# Patient Record
Sex: Female | Born: 1965 | Race: White | Hispanic: No | Marital: Married | Smoking: Never smoker
Health system: Southern US, Community
[De-identification: ages and names within clinical notes are randomized; demographics above are authoritative.]

## PROBLEM LIST (undated history)

## (undated) DIAGNOSIS — R911 Solitary pulmonary nodule: Secondary | ICD-10-CM

## (undated) DIAGNOSIS — L509 Urticaria, unspecified: Secondary | ICD-10-CM

## (undated) DIAGNOSIS — K219 Gastro-esophageal reflux disease without esophagitis: Secondary | ICD-10-CM

## (undated) DIAGNOSIS — T7840XA Allergy, unspecified, initial encounter: Secondary | ICD-10-CM

## (undated) DIAGNOSIS — I10 Essential (primary) hypertension: Secondary | ICD-10-CM

## (undated) DIAGNOSIS — IMO0002 Reserved for concepts with insufficient information to code with codable children: Secondary | ICD-10-CM

## (undated) HISTORY — PX: TONSILLECTOMY: SUR1361

## (undated) HISTORY — PX: UTERINE SEPTUM RESECTION: SHX5386

## (undated) HISTORY — PX: SINOSCOPY: SHX187

## (undated) HISTORY — DX: Essential (primary) hypertension: I10

## (undated) HISTORY — PX: APPENDECTOMY: SHX54

## (undated) HISTORY — PX: RHINOPLASTY: SUR1284

## (undated) HISTORY — DX: Urticaria, unspecified: L50.9

## (undated) HISTORY — PX: KIDNEY SURGERY: SHX687

## (undated) HISTORY — DX: Reserved for concepts with insufficient information to code with codable children: IMO0002

## (undated) HISTORY — DX: Allergy, unspecified, initial encounter: T78.40XA

## (undated) HISTORY — DX: Solitary pulmonary nodule: R91.1

## (undated) HISTORY — PX: CHOLECYSTECTOMY: SHX55

## (undated) HISTORY — DX: Gastro-esophageal reflux disease without esophagitis: K21.9

---

## 2012-03-03 ENCOUNTER — Ambulatory Visit: Payer: Self-pay

## 2012-03-26 ENCOUNTER — Other Ambulatory Visit: Payer: Self-pay

## 2012-03-26 ENCOUNTER — Ambulatory Visit (INDEPENDENT_AMBULATORY_CARE_PROVIDER_SITE_OTHER): Payer: BC Managed Care – PPO | Admitting: General Surgery

## 2012-03-26 ENCOUNTER — Encounter: Payer: Self-pay | Admitting: General Surgery

## 2012-03-26 VITALS — BP 124/76 | HR 72 | Resp 14 | Ht 64.0 in | Wt 174.0 lb

## 2012-03-26 DIAGNOSIS — N63 Unspecified lump in unspecified breast: Secondary | ICD-10-CM

## 2012-03-26 DIAGNOSIS — R928 Other abnormal and inconclusive findings on diagnostic imaging of breast: Secondary | ICD-10-CM

## 2012-03-26 NOTE — Patient Instructions (Signed)

## 2012-03-26 NOTE — Progress Notes (Addendum)
Patient ID: Bonnie Peck, female   DOB: [birth year], 47 y.o.   MRN: [id number]  Chief Complaint  Patient presents with  . Follow-up    mammogram    HPI Bonnie Peck is a 47 y.o. female here today for a follow up mammogram 03/03/12 cat 4 @[facility] .Patient states she feel no lumps and has never had breast problems before.  HPI  Past Medical History  Diagnosis Date  . Hypertension     Past Surgical History  Procedure Laterality Date  . Kidney surgery Left born   . Appendectomy  age 59  . Cesarean section  1998    Family History  Problem Relation Age of Onset  . Melanoma Maternal Grandmother     Social History History  Substance Use Topics  . Smoking status: Not on file  . Smokeless tobacco: Never Used  . Alcohol Use: Yes    Allergies  Allergen Reactions  . Corticosteroids Hives  . Penicillins Hives  . Sulfa Antibiotics Hives    No current outpatient prescriptions on file.   No current facility-administered medications for this visit.    Review of Systems Review of Systems  Constitutional: Negative.   Respiratory: Negative.   Cardiovascular: Negative.     Blood pressure 124/76, pulse 72, resp. rate 14, height 5\' 4"  (1.626 m), weight 174 lb (78.926 kg), last menstrual period 03/23/2012.  Physical Exam Physical Exam  Constitutional: She appears well-developed and well-nourished.  Neck: Normal range of motion. Neck supple.  Cardiovascular: Normal rate, regular rhythm and normal heart sounds.   Pulmonary/Chest: Right breast exhibits no inverted nipple, no mass, no nipple discharge, no skin change and no tenderness. Left breast exhibits no inverted nipple, no mass, no nipple discharge, no skin change and no tenderness. Breasts are asymmetrical (left breast fuller than right ).    Data Reviewed Screening mammogram dated 02/22/2012 showed dense breast. Benign calcifications bilaterally. Irregular density in the upper-outer quadrant of the right breast for which  additional views were requested. I read-0.  Focal spot compression views and ultrasound of the right breast in 03/03/2012 showed a persistent nodular density at the 11:00 position. Ultrasound showed a 0.5 x 0.8 x 1.0 cm complex nodule. This was felt to correspond to the mammographic abnormality and was indeterminate. BIRAD-4.  Ultrasound completed today confirming the above mentioned lesion biopsy was completed.  Assessment    Right breast nodule    Plan    Biopsy will be completed. Follow up be based on the results of that study.       Earline Mayotte 03/29/2012, 11:18 AM  Pathology from the biopsy completed 03/26/2012 is benign. Results have been conveyed to the patient.  PATH REPORT.SITE OF ORIGIN SPEC  Comment   Comments: Material submitted: . BREAST, RIGHT 11:00 ULTRASOUND GUIDED CORE BIOPSY *STAT* PATH REPORT.FINAL DX SPEC  Comment   Comments: Clinician provided ICD-9: 611.72 ; Lump or mass in breast PATH REPORT.RELEVANT HX SPEC  Comment   Comments: Pre-operative diagnosis: . RIGHT BREAST 11:00, US GUIDED PATH REPORT.FINAL DX SPEC  Comment   Comments: Diagnosis: BREAST, RIGHT 11:00 ULTRASOUND GUIDED CORE BIOPSY *STAT*: - BENIGN BREAST TISSUE WITH FIBROCYSTIC CHANGES INCLUDING CYST WALL FRAGMENTS WITH APOCRINE METAPLASIA. NOTE: These results were called to Dr. Lemar Livings at 1420 on 03/27/12. RVA/03/27/2012 SIGNED OUT BY:  Comment   Comments: Electronically signed: Kermit Balo, MD, Pathologist

## 2012-03-27 ENCOUNTER — Telehealth: Payer: Self-pay | Admitting: *Deleted

## 2012-03-27 ENCOUNTER — Telehealth: Payer: Self-pay | Admitting: General Surgery

## 2012-03-27 LAB — PATHOLOGY

## 2012-03-27 NOTE — Telephone Encounter (Signed)
Telephone report from Filbert Schilder, MD in pathology re: right breast biopsy of March 26, 2012 showed apocrine metaplasia and cyst material. (Benign).  Message left for the patient to call for details. She will follow up with the nurse in one week for a wound check.

## 2012-03-27 NOTE — Telephone Encounter (Signed)
Reviewed as documented by Dr. Lemar Livings, pt pleased.

## 2012-03-29 ENCOUNTER — Encounter: Payer: Self-pay | Admitting: General Surgery

## 2012-04-01 ENCOUNTER — Ambulatory Visit (INDEPENDENT_AMBULATORY_CARE_PROVIDER_SITE_OTHER): Payer: BC Managed Care – PPO | Admitting: *Deleted

## 2012-04-01 DIAGNOSIS — N63 Unspecified lump in unspecified breast: Secondary | ICD-10-CM

## 2012-04-01 NOTE — Patient Instructions (Addendum)
Patient here today for follow up post breast biopsy.  Dressing removed, steristrip in place and aware it may come off in one week.  Minimal bruising noted.  The patient is aware that a heating pad may be used for comfort as needed.  Aware of pathology. Follow up as scheduled 6 months or sooner if needed.

## 2012-04-01 NOTE — Progress Notes (Signed)
Patient here today for follow up post right breast biopsy.  Dressing removed, steristrip in place and aware it may come off in one week.  Minimal bruising noted.  The patient is aware that a heating pad may be used for comfort as needed.  Aware of pathology. Follow up as scheduled in 6 months.

## 2012-08-21 ENCOUNTER — Telehealth: Payer: Self-pay | Admitting: General Surgery

## 2012-08-21 NOTE — Telephone Encounter (Signed)
Documents from March 2014 biopsy reviewed. Benign lesion. Patient has declined f/u here. F/U with PCP per her desired.

## 2012-09-24 ENCOUNTER — Ambulatory Visit: Payer: BC Managed Care – PPO | Admitting: General Surgery

## 2013-11-02 ENCOUNTER — Encounter: Payer: Self-pay | Admitting: General Surgery

## 2014-04-09 ENCOUNTER — Emergency Department
Admission: EM | Admit: 2014-04-09 | Discharge: 2014-04-09 | Disposition: A | Discharge status: Home / Self Care | Payer: BLUE CROSS/BLUE SHIELD | Source: Home / Self Care | Attending: Emergency Medicine | Admitting: Emergency Medicine

## 2014-04-09 ENCOUNTER — Encounter: Payer: Self-pay | Admitting: *Deleted

## 2014-04-09 DIAGNOSIS — N390 Urinary tract infection, site not specified: Secondary | ICD-10-CM

## 2014-04-09 LAB — POCT URINALYSIS DIP (MANUAL ENTRY)
Bilirubin, UA: NEGATIVE
Blood, UA: NEGATIVE
Glucose, UA: NEGATIVE
Ketones, POC UA: NEGATIVE
Nitrite, UA: NEGATIVE
Protein Ur, POC: NEGATIVE
Spec Grav, UA: 1.02 (ref 1.005–1.03)
Urobilinogen, UA: 0.2 (ref 0–1)
pH, UA: 7 (ref 5–8)

## 2014-04-09 MED ORDER — FLUCONAZOLE 150 MG PO TABS: 150.0000 mg | ORAL_TABLET | Freq: Every day | ORAL | Status: DC

## 2014-04-09 MED ORDER — NITROFURANTOIN MONOHYD MACRO 100 MG PO CAPS: 100.0000 mg | ORAL_CAPSULE | Freq: Two times a day (BID) | ORAL | Status: DC

## 2014-04-09 NOTE — ED Provider Notes (Signed)
CSN: [id number]     Arrival date & time 04/09/14  1547 History   First MD Initiated Contact with Patient 04/09/14 1645     Chief Complaint  Patient presents with  . Dysuria   (Consider location/radiation/quality/duration/timing/severity/associated sxs/prior Treatment) Patient is a 49 y.o. female presenting with dysuria. The history is provided by the patient. No language interpreter was used.  Dysuria Pain quality:  Burning Pain severity:  Mild Onset quality:  Gradual Duration:  4 days Timing:  Constant Progression:  Worsening Chronicity:  New Recent urinary tract infections: no   Relieved by:  Nothing Worsened by:  Nothing tried Ineffective treatments:  None tried Urinary symptoms: no frequent urination   Associated symptoms: nausea   Risk factors: no recurrent urinary tract infections    patient is currently on Keflex for ear infection and strep throat. She began having burning with urination 4 days ago.  patient reports she has a history of urinary tract infection she usually responds well to [facility]  Past Medical History  Diagnosis Date  . Hypertension    Past Surgical History  Procedure Laterality Date  . Kidney surgery Left born   . Appendectomy  age 33  . Cesarean section  1998  . Uterine septum resection     Family History  Problem Relation Age of Onset  . Melanoma Maternal Grandmother   . Heart attack Mother   . Diabetes Mother   . Hypertension Mother   . Hyperlipidemia Father    History  Substance Use Topics  . Smoking status: Never Smoker   . Smokeless tobacco: Never Used  . Alcohol Use: Yes   OB History    Obstetric Comments   First menstrual 11     Review of Systems  Gastrointestinal: Positive for nausea.  Genitourinary: Positive for dysuria.  All other systems reviewed and are negative.   Allergies  Ciprofloxacin; Corticosteroids; Erythromycin; Penicillins; and Sulfa antibiotics  Home Medications   Prior to Admission medications    Medication Sig Start Date End Date Taking? Authorizing Provider  cephALEXin (KEFLEX) 500 MG capsule Take 500 mg by mouth 4 (four) times daily.   Yes Historical Provider, MD  labetalol (NORMODYNE) 200 MG tablet Take 200 mg by mouth 2 (two) times daily.   Yes Historical Provider, MD  fluconazole (DIFLUCAN) 150 MG tablet Take 1 tablet (150 mg total) by mouth daily. 04/09/14   Fransico Meadow, PA-C  nitrofurantoin, macrocrystal-monohydrate, (MACROBID) 100 MG capsule Take 1 capsule (100 mg total) by mouth 2 (two) times daily. 04/09/14   Fransico Meadow, PA-C   BP 173/101 mmHg  Pulse 86  Temp(Src) 98.9 F (37.2 C) (Oral)  Resp 14  Ht 5\' 4"  (1.626 m)  Wt 191 lb (86.637 kg)  BMI 32.77 kg/m2  SpO2 99%  LMP 03/09/2014 Physical Exam  Constitutional: She is oriented to person, place, and time. She appears well-developed and well-nourished.  HENT:  Head: Normocephalic and atraumatic.  Eyes: Conjunctivae and EOM are normal. Pupils are equal, round, and reactive to light.  Neck: Normal range of motion.  Cardiovascular: Normal rate.   Pulmonary/Chest: Effort normal and breath sounds normal.  Abdominal: Soft. She exhibits no distension.  Musculoskeletal: Normal range of motion.  Neurological: She is alert and oriented to person, place, and time.  Skin: Skin is warm.  Psychiatric: She has a normal mood and affect.  Nursing note and vitals reviewed.   ED Course  Procedures (including critical care time) Labs Review Labs Reviewed  URINE CULTURE  POCT URINALYSIS DIP (MANUAL ENTRY)   Trace of leukocytes Imaging Review No results found.   MDM urine culture pending   1. Urinary tract infection without hematuria, site unspecified    macrobid Diflucan AVS    Fransico Meadow, PA-C 04/09/14 2012

## 2014-04-09 NOTE — ED Notes (Signed)
Pt c/o dysuria and polyuria x 3-4 days. Currently on Keflex for ear infection and strep. Allergic to several antibiotics. Macrobid has worked well in the past. 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

## 2014-04-09 NOTE — Discharge Instructions (Signed)

## 2014-04-11 ENCOUNTER — Telehealth: Payer: Self-pay | Admitting: *Deleted

## 2014-04-11 LAB — URINE CULTURE
Colony Count: NO GROWTH
Organism ID, Bacteria: NO GROWTH

## 2014-05-04 ENCOUNTER — Encounter: Payer: Self-pay | Admitting: *Deleted

## 2014-05-04 ENCOUNTER — Emergency Department
Admission: EM | Admit: 2014-05-04 | Discharge: 2014-05-04 | Disposition: A | Discharge status: Home / Self Care | Payer: BLUE CROSS/BLUE SHIELD | Source: Home / Self Care | Attending: Emergency Medicine | Admitting: Emergency Medicine

## 2014-05-04 DIAGNOSIS — J029 Acute pharyngitis, unspecified: Secondary | ICD-10-CM | POA: Diagnosis not present

## 2014-05-04 LAB — POCT RAPID STREP A (OFFICE): Rapid Strep A Screen: NEGATIVE

## 2014-05-04 MED ORDER — CEPHALEXIN 500 MG PO CAPS: 500.0000 mg | ORAL_CAPSULE | Freq: Three times a day (TID) | ORAL | Status: DC

## 2014-05-04 NOTE — ED Notes (Addendum)
Pt c/o sore throat, white spots on throat, and swollen glands x 11 days. She took old rx for keflex bid for 3 days with minimal relief.

## 2014-05-04 NOTE — ED Provider Notes (Signed)
CSN: [id number]     Arrival date & time 05/04/14  1107 History   First MD Initiated Contact with Patient 05/04/14 1109     Chief Complaint  Patient presents with  . Sore Throat   Here with husband HPI SORE THROAT Onset: 11 days ago.--She's used left over Keflex 500 twice a day for the past 5 days and she feels somewhat improved on this and requests a full prescription for Keflex. Note that she is allergic to penicillin, but has taken Keflex in the past without side effects or reactions.    Severity: moderate Tried OTC meds without significant relief.  Symptoms:  + Low-grade Fever  + Swollen neck glands No Recent Strep Exposure     No Myalgias No Headache No Rash Positive for fatigue  No Discolored Nasal Mucus No Allergy symptoms No sinus pain/pressure No itchy/red eyes Positive bilateral mild ear pain but no drainage or any acute hearing change.  No Drooling No Trismus  No Nausea No Vomiting No Abdominal pain No Diarrhea No Reflux symptoms  No Cough No Breathing Difficulty No Shortness of Breath No pleuritic pain No Wheezing No Hemoptysis  Remainder of Review of Systems negative for acute change except as noted in the HPI.  Past Medical History  Diagnosis Date  . Hypertension    Past Surgical History  Procedure Laterality Date  . Kidney surgery Left born   . Appendectomy  age 71  . Cesarean section  1998  . Uterine septum resection     Family History  Problem Relation Age of Onset  . Melanoma Maternal Grandmother   . Heart attack Mother   . Diabetes Mother   . Hypertension Mother   . Hyperlipidemia Father    History  Substance Use Topics  . Smoking status: Never Smoker   . Smokeless tobacco: Never Used  . Alcohol Use: Yes   OB History    Obstetric Comments   First menstrual 11     Review of Systems Remainder of Review of Systems negative for acute change except as noted in the HPI.  Allergies  Ciprofloxacin; Corticosteroids;  Erythromycin; Penicillins; and Sulfa antibiotics  Home Medications   Prior to Admission medications   Medication Sig Start Date End Date Taking? Authorizing Provider  cephALEXin (KEFLEX) 500 MG capsule Take 1 capsule (500 mg total) by mouth 3 (three) times daily. For 10 days 05/04/14   Jacqulyn Cane, MD  labetalol (NORMODYNE) 200 MG tablet Take 200 mg by mouth 2 (two) times daily.    Historical Provider, MD   BP 161/101 mmHg  Pulse 87  Temp(Src) 98.6 F (37 C) (Oral)  Resp 16  Ht 5\' 4"  (1.626 m)  Wt 189 lb (85.73 kg)  BMI 32.43 kg/m2  SpO2 98%  LMP 04/20/2014 Physical Exam  Constitutional: She is oriented to person, place, and time. She appears well-developed and well-nourished. She is cooperative.  Non-toxic appearance. No distress.  HENT:  Head: Normocephalic and atraumatic.  Right Ear: Tympanic membrane, external ear and ear canal normal.  Left Ear: Tympanic membrane, external ear and ear canal normal.  Nose: Nose normal. Right sinus exhibits no maxillary sinus tenderness and no frontal sinus tenderness. Left sinus exhibits no maxillary sinus tenderness and no frontal sinus tenderness.  Mouth/Throat: Mucous membranes are normal. Posterior oropharyngeal erythema present. No oropharyngeal exudate or posterior oropharyngeal edema.  Posterior pharynx very red without exudate. Surgically absent tonsils.  Eyes: Conjunctivae are normal. No scleral icterus.  Neck: Neck supple.  Cardiovascular: Normal  rate, regular rhythm and normal heart sounds.   No murmur heard. Pulmonary/Chest: Effort normal and breath sounds normal. No stridor. No respiratory distress. She has no wheezes. She has no rales.  Musculoskeletal: She exhibits no edema.  Lymphadenopathy:    She has cervical adenopathy.       Right cervical: Superficial cervical adenopathy present. No deep cervical and no posterior cervical adenopathy present.      Left cervical: Superficial cervical adenopathy present. No deep cervical and  no posterior cervical adenopathy present.  Neurological: She is alert and oriented to person, place, and time.  Skin: Skin is warm and dry.  Psychiatric: She has a normal mood and affect.  Nursing note and vitals reviewed.   ED Course  Procedures (including critical care time) Labs Review Labs Reviewed  POCT RAPID STREP A (OFFICE)   Results for orders placed or performed during the hospital encounter of 05/04/14  POCT rapid strep A  Result Value Ref Range   Rapid Strep A Screen Negative Negative    MDM   1. Acute pharyngitis, unspecified pharyngitis type    Treatment options discussed, as well as risks, benefits, alternatives. She declined sending strep culture off Patient voiced understanding and agreement with the following plans: cephALEXin (KEFLEX) 500 MG capsule Take 1 capsule (500 mg total) by mouth 3 (three) times daily. For 10 days   Other symptomatic care discussed Follow-up with your primary care doctor in 5-7 days if not improving, or sooner if symptoms become worse. Precautions discussed. Red flags discussed. Questions invited and answered. Patient voiced understanding and agreement.      Jacqulyn Cane, MD 05/04/14 [phone]

## 2015-03-11 ENCOUNTER — Ambulatory Visit (INDEPENDENT_AMBULATORY_CARE_PROVIDER_SITE_OTHER): Payer: BLUE CROSS/BLUE SHIELD | Admitting: Family Medicine

## 2015-03-11 ENCOUNTER — Encounter: Payer: Self-pay | Admitting: Family Medicine

## 2015-03-11 VITALS — BP 197/124 | HR 80 | Ht 65.5 in | Wt 187.0 lb

## 2015-03-11 DIAGNOSIS — Q600 Renal agenesis, unilateral: Secondary | ICD-10-CM | POA: Insufficient documentation

## 2015-03-11 DIAGNOSIS — L659 Nonscarring hair loss, unspecified: Secondary | ICD-10-CM | POA: Diagnosis not present

## 2015-03-11 DIAGNOSIS — B839 Helminthiasis, unspecified: Secondary | ICD-10-CM

## 2015-03-11 DIAGNOSIS — I10 Essential (primary) hypertension: Secondary | ICD-10-CM

## 2015-03-11 MED ORDER — ALBENDAZOLE 200 MG PO TABS: ORAL_TABLET | ORAL | Status: DC

## 2015-03-11 NOTE — Progress Notes (Signed)
CC: Bonnie Peck is a 50 y.o. female is here for Establish Care   Subjective: HPI:  Very pleasant 50 year old here to establish care, wife of Bonnie Peck  She has a history of essential hypertension she's tried multiple antihypertensives however always seems to get some form of intolerance or side effects that makes her stop her current antihypertensive medication. She had been taking labetalol and tolerating this however after stopping the medication she's noticed that blood pressures at home consistently are below 140/90.  Earlier this week she noticed worms in her stool. This happened 1 week after she was petting some kittens at a local pet store and was told by the manager that these were highly infectious. She denies any abdominal pain, nausea, nor blood in stool.  She's been experiencing some hair loss for the past 6+ months. She had blood work done by her OB/GYN last week and was told everything looks normal but she would like to know the specifics of the results. She is worried she may be hypothyroid   Review of Systems - General ROS: negative for - chills, fever, night sweats, weight gain or weight loss Ophthalmic ROS: negative for - decreased vision Psychological ROS: negative for - anxiety or depression ENT ROS: negative for - hearing change, nasal congestion, tinnitus or allergies Hematological and Lymphatic ROS: negative for - bleeding problems, bruising or swollen lymph nodes Breast ROS: negative Respiratory ROS: no cough, shortness of breath, or wheezing Cardiovascular ROS: no chest pain or dyspnea on exertion Gastrointestinal ROS: no abdominal pain, change in bowel habits, or black or bloody stools Genito-Urinary ROS: negative for - genital discharge, genital ulcers, incontinence or abnormal bleeding from genitals Musculoskeletal ROS: negative for - joint pain or muscle pain Neurological ROS: negative for - headaches or memory loss Dermatological ROS: negative for lumps, mole  changes, rash and skin lesion changes  Past Medical History  Diagnosis Date  . Hypertension     Past Surgical History  Procedure Laterality Date  . Kidney surgery Left born   . Appendectomy  age 36  . Cesarean section  1998  . Uterine septum resection     Family History  Problem Relation Age of Onset  . Melanoma Maternal Grandmother   . Heart attack Mother   . Diabetes Mother   . Hypertension Mother   . Hyperlipidemia Father     Social History   Social History  . Marital Status: Married    Spouse Name: N/A  . Number of Children: N/A  . Years of Education: N/A   Occupational History  . Not on file.   Social History Main Topics  . Smoking status: Never Smoker   . Smokeless tobacco: Never Used  . Alcohol Use: Yes  . Drug Use: No  . Sexual Activity: Not on file   Other Topics Concern  . Not on file   Social History Narrative     Objective: BP 197/124 mmHg  Pulse 80  Ht 5' 5.5" (1.664 m)  Wt 187 lb (84.823 kg)  BMI 30.63 kg/m2  Vital signs reviewed. General: Alert and Oriented, No Acute Distress HEENT: Pupils equal, round, reactive to light. Conjunctivae clear.  External ears unremarkable.  Moist mucous membranes. Lungs: Clear and comfortable work of breathing, speaking in full sentences without accessory muscle use. Cardiac: Regular rate and rhythm.  Neuro: CN II-XII grossly intact, gait normal. Extremities: No peripheral edema.  Strong peripheral pulses.  Mental Status: No depression, anxiety, nor agitation. Logical though process. Skin: Warm  and dry.  Assessment & Plan: Bonnie Peck was seen today for establish care.  Diagnoses and all orders for this visit:  Essential hypertension  Worms in stool -     albendazole (ALBENZA) 200 MG tablet; Two by mouth at once.  Hair loss   Essential hypertension: Will consider this white coat hypertension provided she can continue to monitor her blood pressure at home and states below 140/90 Worms in stool: Start  single dose of albendazole, although one week if still noticing foreign fecal matter Hair Loss: Obtaining outside records, I'll let her know what my interpretation is on these labs and if she needs to have any additional lab work done.  Return in about 6 months (around 09/11/2015) for BP Follow up.

## 2015-03-25 ENCOUNTER — Ambulatory Visit (INDEPENDENT_AMBULATORY_CARE_PROVIDER_SITE_OTHER): Payer: BLUE CROSS/BLUE SHIELD | Admitting: Family Medicine

## 2015-03-25 ENCOUNTER — Ambulatory Visit (INDEPENDENT_AMBULATORY_CARE_PROVIDER_SITE_OTHER): Payer: BLUE CROSS/BLUE SHIELD

## 2015-03-25 ENCOUNTER — Telehealth: Payer: Self-pay | Admitting: Family Medicine

## 2015-03-25 ENCOUNTER — Encounter: Payer: Self-pay | Admitting: Family Medicine

## 2015-03-25 VITALS — BP 202/139 | HR 90 | Wt 189.0 lb

## 2015-03-25 DIAGNOSIS — K8020 Calculus of gallbladder without cholecystitis without obstruction: Secondary | ICD-10-CM

## 2015-03-25 DIAGNOSIS — R109 Unspecified abdominal pain: Secondary | ICD-10-CM | POA: Diagnosis not present

## 2015-03-25 DIAGNOSIS — N281 Cyst of kidney, acquired: Secondary | ICD-10-CM

## 2015-03-25 DIAGNOSIS — G4700 Insomnia, unspecified: Secondary | ICD-10-CM | POA: Diagnosis not present

## 2015-03-25 LAB — POCT URINALYSIS DIPSTICK
Bilirubin, UA: NEGATIVE
Glucose, UA: NEGATIVE
Ketones, UA: NEGATIVE
Leukocytes, UA: NEGATIVE
Nitrite, UA: NEGATIVE
PH UA: 6
PROTEIN UA: NEGATIVE
SPEC GRAV UA: 1.02
Urobilinogen, UA: 0.2

## 2015-03-25 MED ORDER — CEPHALEXIN 500 MG PO CAPS: 500.0000 mg | ORAL_CAPSULE | Freq: Three times a day (TID) | ORAL | Status: DC

## 2015-03-25 MED ORDER — ZOLPIDEM TARTRATE 10 MG PO TABS: 5.0000 mg | ORAL_TABLET | Freq: Every evening | ORAL | Status: DC | PRN

## 2015-03-25 NOTE — Telephone Encounter (Signed)
Will you please let patient know that her ultrasound did not show any signs of a kidney stone.  The radiologist wasn't able to completely visualize the center of the kidney and has recommended a non-emergent ct scan to rule out any kidney anatomy abnormality.

## 2015-03-25 NOTE — Telephone Encounter (Signed)
Pt.notified

## 2015-03-25 NOTE — Progress Notes (Signed)
CC: Bonnie Peck is a 50 y.o. female is here for Flank Pain   Subjective: HPI:  2 weeks of right-sided flank pain that is ranging from 5-10 on the 0-10 pain scale. Nothing seems to make it better or worse it's slowly worsening since onset. Keeps her awake at night. It radiates into the right side of the anterior pelvis. Recent history significant for bacterial vaginosis treated with Flagyl and then later a strep UTI treated with Macrobid. She denies blood in her urine. She denies urinary urgency or hesitancy. She denies any other genitourinary complaints. She's been somewhat nauseous but denies fevers or chills. She denies back pain. She denies any gastrointestinal complaints.   Review Of Systems Outlined In HPI  Past Medical History  Diagnosis Date  . Hypertension     Past Surgical History  Procedure Laterality Date  . Kidney surgery Left born   . Appendectomy  age 52  . Cesarean section  1998  . Uterine septum resection    . Rhinoplasty     Family History  Problem Relation Age of Onset  . Melanoma Maternal Grandmother   . Heart attack Maternal Grandmother   . Heart attack Mother   . Diabetes Mother   . Hypertension Mother   . Hyperlipidemia Father   . Colon cancer Father     Social History   Social History  . Marital Status: Married    Spouse Name: N/A  . Number of Children: N/A  . Years of Education: N/A   Occupational History  . Not on file.   Social History Main Topics  . Smoking status: Never Smoker   . Smokeless tobacco: Never Used  . Alcohol Use: Yes  . Drug Use: No  . Sexual Activity: Yes   Other Topics Concern  . Not on file   Social History Narrative     Objective: BP 202/139 mmHg  Pulse 90  Wt 189 lb (85.73 kg)  Vital signs reviewed. General: Alert and Oriented, No Acute Distress HEENT: Pupils equal, round, reactive to light. Conjunctivae clear.  External ears unremarkable.  Moist mucous membranes. Lungs: Clear and comfortable work of  breathing, speaking in full sentences without accessory muscle use. Cardiac: Regular rate and rhythm.  Neuro: CN II-XII grossly intact, gait normal. Extremities: No peripheral edema.  Strong peripheral pulses.  Mental Status: No depression, anxiety, nor agitation. Logical though process. Skin: Warm and dry. No right-sided CVA tenderness  Assessment & Plan: Bonnie Peck was seen today for flank pain.  Diagnoses and all orders for this visit:  Flank pain -     POCT Urinalysis Dipstick -     Urine Culture; Standing -     Urine Culture -     US Renal  Insomnia -     zolpidem (AMBIEN) 10 MG tablet; Take 0.5-1 tablets (5-10 mg total) by mouth at bedtime as needed for sleep.  Other orders -     cephALEXin (KEFLEX) 500 MG capsule; Take 1 capsule (500 mg total) by mouth 3 (three) times daily.   Flank pain: Blood in urine most likely secondary to not eradicated strep infection seen earlier this month with her OB/GYN. Start Keflex pending a renal ultrasound to rule out nephrolithiasis. Symptoms have been keeping her awake at night she was not herself and she can take to help with sleep. No Follow-up on file.

## 2015-03-26 LAB — URINE CULTURE

## 2015-03-28 ENCOUNTER — Other Ambulatory Visit: Payer: Self-pay | Admitting: Family Medicine

## 2015-03-28 DIAGNOSIS — R93429 Abnormal radiologic findings on diagnostic imaging of unspecified kidney: Secondary | ICD-10-CM

## 2015-03-28 LAB — BASIC METABOLIC PANEL
BUN: 9 mg/dL (ref 7–25)
CHLORIDE: 106 mmol/L (ref 98–110)
CO2: 23 mmol/L (ref 20–31)
CREATININE: 0.64 mg/dL (ref 0.50–1.10)
Calcium: 8.8 mg/dL (ref 8.6–10.2)
Glucose, Bld: 81 mg/dL (ref 65–99)
POTASSIUM: 3.9 mmol/L (ref 3.5–5.3)
Sodium: 139 mmol/L (ref 135–146)

## 2015-03-29 ENCOUNTER — Ambulatory Visit (INDEPENDENT_AMBULATORY_CARE_PROVIDER_SITE_OTHER): Payer: BLUE CROSS/BLUE SHIELD

## 2015-03-29 DIAGNOSIS — I700 Atherosclerosis of aorta: Secondary | ICD-10-CM | POA: Diagnosis not present

## 2015-03-29 DIAGNOSIS — Z905 Acquired absence of kidney: Secondary | ICD-10-CM

## 2015-03-29 MED ORDER — IOPAMIDOL (ISOVUE-370) INJECTION 76%
100.0000 mL | Freq: Once | INTRAVENOUS | Status: AC | PRN
  Administered 2015-03-29: 100 mL via INTRAVENOUS

## 2015-04-07 DIAGNOSIS — R1031 Right lower quadrant pain: Secondary | ICD-10-CM | POA: Diagnosis not present

## 2015-04-07 DIAGNOSIS — N938 Other specified abnormal uterine and vaginal bleeding: Secondary | ICD-10-CM | POA: Diagnosis not present

## 2015-04-07 DIAGNOSIS — N809 Endometriosis, unspecified: Secondary | ICD-10-CM | POA: Diagnosis not present

## 2015-04-07 DIAGNOSIS — N390 Urinary tract infection, site not specified: Secondary | ICD-10-CM | POA: Diagnosis not present

## 2015-04-11 ENCOUNTER — Telehealth: Payer: Self-pay | Admitting: Family Medicine

## 2015-04-11 DIAGNOSIS — N390 Urinary tract infection, site not specified: Secondary | ICD-10-CM

## 2015-04-11 NOTE — Telephone Encounter (Signed)
Ref req

## 2015-04-19 ENCOUNTER — Ambulatory Visit (INDEPENDENT_AMBULATORY_CARE_PROVIDER_SITE_OTHER): Payer: BLUE CROSS/BLUE SHIELD | Admitting: Family Medicine

## 2015-04-19 ENCOUNTER — Encounter: Payer: Self-pay | Admitting: Family Medicine

## 2015-04-19 VITALS — BP 187/115 | HR 92 | Wt 191.0 lb

## 2015-04-19 DIAGNOSIS — R319 Hematuria, unspecified: Secondary | ICD-10-CM | POA: Insufficient documentation

## 2015-04-19 LAB — POCT URINALYSIS DIPSTICK
BILIRUBIN UA: NEGATIVE
Blood, UA: NEGATIVE
Glucose, UA: NEGATIVE
KETONES UA: NEGATIVE
Leukocytes, UA: NEGATIVE
Nitrite, UA: NEGATIVE
PROTEIN UA: NEGATIVE
SPEC GRAV UA: 1.015
Urobilinogen, UA: 0.2
pH, UA: 8

## 2015-04-19 NOTE — Progress Notes (Signed)
CC: Bonnie Peck is a 50 y.o. female is here for Hematuria   Subjective: HPI:  She's feeling much better since I saw her last and denies any gross blood in her urine however she is fearful that she could still have blood in her urine and something dangerous could be brewing. She doesn't have any symptoms to complain about today other than anxiety. She denies fevers, chills, polyuria or dysuria. She tolerated Keflex without any problem. She had a reassuring CT scan since I saw her last. She denies any urinary or gastrointestinal complaints today   it is worth mentioning that she checked her blood pressure before leaving the house this morning and at home it was 140/85.   Review Of Systems Outlined In HPI  Past Medical History  Diagnosis Date  . Hypertension     Past Surgical History  Procedure Laterality Date  . Kidney surgery Left born   . Appendectomy  age 19  . Cesarean section  1998  . Uterine septum resection    . Rhinoplasty     Family History  Problem Relation Age of Onset  . Melanoma Maternal Grandmother   . Heart attack Maternal Grandmother   . Heart attack Mother   . Diabetes Mother   . Hypertension Mother   . Hyperlipidemia Father   . Colon cancer Father     Social History   Social History  . Marital Status: Married    Spouse Name: N/A  . Number of Children: N/A  . Years of Education: N/A   Occupational History  . Not on file.   Social History Main Topics  . Smoking status: Never Smoker   . Smokeless tobacco: Never Used  . Alcohol Use: Yes  . Drug Use: No  . Sexual Activity: Yes   Other Topics Concern  . Not on file   Social History Narrative     Objective: BP 187/115 mmHg  Pulse 92  Wt 191 lb (86.637 kg)  Vital signs reviewed. General: Alert and Oriented, No Acute Distress HEENT: Pupils equal, round, reactive to light. Conjunctivae clear.  External ears unremarkable.  Moist mucous membranes. Lungs: Clear and comfortable work of breathing,  speaking in full sentences without accessory muscle use. Cardiac: Regular rate and rhythm.  Neuro: CN II-XII grossly intact, gait normal. Extremities: No peripheral edema.  Strong peripheral pulses.  Mental Status: No depression, anxiety, nor agitation. Logical though process. Skin: Warm and dry.  Assessment & Plan: Bonnie Peck was seen today for hematuria.  Diagnoses and all orders for this visit:  Hematuria -     POCT urinalysis dipstick -     Urine culture   Urinalysis is normal today without any evidence of blood this is reassuring patient was happy with this news and I let her know will follow up with results from her culture later this week or early next week. She's decided she's not going to the urologist  No Follow-up on file.

## 2015-04-19 NOTE — Addendum Note (Signed)
Addended by: Delrae Alfred on: 04/19/2015 02:57 PM   Modules accepted: Miquel Dunn

## 2015-04-21 LAB — URINE CULTURE

## 2015-05-15 ENCOUNTER — Encounter: Payer: Self-pay | Admitting: Emergency Medicine

## 2015-05-15 ENCOUNTER — Emergency Department
Admission: EM | Admit: 2015-05-15 | Discharge: 2015-05-15 | Disposition: A | Discharge status: Home / Self Care | Payer: BLUE CROSS/BLUE SHIELD | Source: Home / Self Care | Attending: Family Medicine | Admitting: Family Medicine

## 2015-05-15 DIAGNOSIS — N3090 Cystitis, unspecified without hematuria: Secondary | ICD-10-CM | POA: Diagnosis not present

## 2015-05-15 DIAGNOSIS — R300 Dysuria: Secondary | ICD-10-CM | POA: Diagnosis not present

## 2015-05-15 LAB — POCT URINALYSIS DIP (MANUAL ENTRY)
Bilirubin, UA: NEGATIVE
Glucose, UA: NEGATIVE
Ketones, POC UA: NEGATIVE
Nitrite, UA: NEGATIVE
PROTEIN UA: NEGATIVE
SPEC GRAV UA: 1.02
UROBILINOGEN UA: 0.2
pH, UA: 5.5

## 2015-05-15 MED ORDER — CEFDINIR 300 MG PO CAPS: 300.0000 mg | ORAL_CAPSULE | Freq: Two times a day (BID) | ORAL | Status: DC

## 2015-05-15 NOTE — ED Notes (Signed)
Pt c/o recurrent infections, dysuria, odor, feeling full in abdomen.

## 2015-05-15 NOTE — ED Provider Notes (Signed)
CSN: [id number]     Arrival date & time 05/15/15  1100 History   First MD Initiated Contact with Patient 05/15/15 1127     Chief Complaint  Patient presents with  . Urinary Tract Infection      HPI Comments: Patient complains of one week of gradually increasing dysuria.  She had a UTI last month that resolved with Keflex (although culture showed multiple organisms).  No fevers, chills, and sweats. She reports that she has "white coat hypertension" and her BP at home is usually approximately 140/90.  Patient is a 50 y.o. female presenting with dysuria. The history is provided by the patient and the spouse.  Dysuria Pain quality:  Aching and burning Pain severity:  Mild Onset quality:  Gradual Duration:  1 week Timing:  Constant Progression:  Worsening Chronicity:  Recurrent Recent urinary tract infections: yes   Relieved by:  Phenazopyridine Worsened by:  Nothing tried Ineffective treatments:  None tried Urinary symptoms: foul-smelling urine, frequent urination and hesitancy   Urinary symptoms: no discolored urine, no hematuria and no bladder incontinence   Associated symptoms: abdominal pain and nausea   Associated symptoms: no fever, no flank pain, no genital lesions, no vaginal discharge and no vomiting   Risk factors: recurrent urinary tract infections     Past Medical History  Diagnosis Date  . Hypertension    Past Surgical History  Procedure Laterality Date  . Kidney surgery Left born   . Appendectomy  age 32  . Cesarean section  1998  . Uterine septum resection    . Rhinoplasty     Family History  Problem Relation Age of Onset  . Melanoma Maternal Grandmother   . Heart attack Maternal Grandmother   . Heart attack Mother   . Diabetes Mother   . Hypertension Mother   . Hyperlipidemia Father   . Colon cancer Father    Social History  Substance Use Topics  . Smoking status: Never Smoker   . Smokeless tobacco: Never Used  . Alcohol Use: Yes   OB History    Obstetric Comments   First menstrual 13     Review of Systems  Constitutional: Negative for fever.  Gastrointestinal: Positive for nausea and abdominal pain. Negative for vomiting.  Genitourinary: Positive for dysuria. Negative for flank pain and vaginal discharge.  All other systems reviewed and are negative.   Allergies  Vancomycin; Ciprofloxacin; Corticosteroids; Erythromycin; Penicillins; and Sulfa antibiotics  Home Medications   Prior to Admission medications   Medication Sig Start Date End Date Taking? Authorizing Provider  L-ARGININE PO Take by mouth.   Yes Historical Provider, MD  cefdinir (OMNICEF) 300 MG capsule Take 1 capsule (300 mg total) by mouth 2 (two) times daily. 05/15/15   Kandra Nicolas, MD   Meds Ordered and Administered this Visit  Medications - No data to display  BP 167/134 mmHg  Pulse 108  Temp(Src) 98.7 F (37.1 C) (Oral)  Ht 5\' 4"  (1.626 m)  Wt 191 lb 8 oz (86.864 kg)  BMI 32.85 kg/m2  SpO2 98%  LMP 05/04/2015 No data found.   Physical Exam Nursing notes and Vital Signs reviewed. Appearance:  Patient appears stated age, and in no acute distress.    Eyes:  Pupils are equal, round, and reactive to light and accomodation.  Extraocular movement is intact.  Conjunctivae are not inflamed   Pharynx:  Normal; moist mucous membranes  Neck:  Supple.  No adenopathy Lungs:  Clear to auscultation.  Breath sounds  are equal.  Moving air well. Heart:  Regular rate and rhythm without murmurs, rubs, or gallops.  Abdomen:  Mild tenderness over bladder without masses or hepatosplenomegaly.  Bowel sounds are present.  No CVA or flank tenderness.  Extremities:  No edema.  Skin:  No rash present.    ED Course  Procedures none    Labs Reviewed  POCT URINALYSIS DIP (MANUAL ENTRY) - Abnormal; Notable for the following:    Blood, UA trace-intact (*)    Leukocytes, UA moderate (2+) (*)    All other components within normal limits  URINE CULTURE     MDM   1.  Cystitis    Urine culture pending. Begin Omnicef 300mg  BID Increase fluid intake. May use non-prescription AZO for about two days, if desired, to decrease urinary discomfort.  If symptoms become significantly worse during the night or over the weekend, proceed to the local emergency room.  Try taking cranberry pills for future prevention.  Recommend monitoring of BP at home; if increased above 140/90, follow-up with PCP    Kandra Nicolas, MD 05/15/15 1152

## 2015-05-15 NOTE — Discharge Instructions (Signed)
Increase fluid intake. May use non-prescription AZO for about two days, if desired, to decrease urinary discomfort.  If symptoms become significantly worse during the night or over the weekend, proceed to the local emergency room.  Try taking cranberry pills for future prevention.   Urinary Tract Infection Urinary tract infections (UTIs) can develop anywhere along your urinary tract. Your urinary tract is your body's drainage system for removing wastes and extra water. Your urinary tract includes two kidneys, two ureters, a bladder, and a urethra. Your kidneys are a pair of bean-shaped organs. Each kidney is about the size of your fist. They are located below your ribs, one on each side of your spine. CAUSES Infections are caused by microbes, which are microscopic organisms, including fungi, viruses, and bacteria. These organisms are so small that they can only be seen through a microscope. Bacteria are the microbes that most commonly cause UTIs. SYMPTOMS  Symptoms of UTIs may vary by age and gender of the patient and by the location of the infection. Symptoms in young women typically include a frequent and intense urge to urinate and a painful, burning feeling in the bladder or urethra during urination. Older women and men are more likely to be tired, shaky, and weak and have muscle aches and abdominal pain. A fever may mean the infection is in your kidneys. Other symptoms of a kidney infection include pain in your back or sides below the ribs, nausea, and vomiting. DIAGNOSIS To diagnose a UTI, your caregiver will ask you about your symptoms. Your caregiver will also ask you to provide a urine sample. The urine sample will be tested for bacteria and white blood cells. White blood cells are made by your body to help fight infection. TREATMENT  Typically, UTIs can be treated with medication. Because most UTIs are caused by a bacterial infection, they usually can be treated with the use of antibiotics.  The choice of antibiotic and length of treatment depend on your symptoms and the type of bacteria causing your infection. HOME CARE INSTRUCTIONS  If you were prescribed antibiotics, take them exactly as your caregiver instructs you. Finish the medication even if you feel better after you have only taken some of the medication.  Drink enough water and fluids to keep your urine clear or pale yellow.  Avoid caffeine, tea, and carbonated beverages. They tend to irritate your bladder.  Empty your bladder often. Avoid holding urine for long periods of time.  Empty your bladder before and after sexual intercourse.  After a bowel movement, women should cleanse from front to back. Use each tissue only once. SEEK MEDICAL CARE IF:   You have back pain.  You develop a fever.  Your symptoms do not begin to resolve within 3 days. SEEK IMMEDIATE MEDICAL CARE IF:   You have severe back pain or lower abdominal pain.  You develop chills.  You have nausea or vomiting.  You have continued burning or discomfort with urination. MAKE SURE YOU:   Understand these instructions.  Will watch your condition.  Will get help right away if you are not doing well or get worse.   This information is not intended to replace advice given to you by your health care provider. Make sure you discuss any questions you have with your health care provider.   Document Released: 09/27/2004 Document Revised: 09/08/2014 Document Reviewed: 01/26/2011 Elsevier Interactive Patient Education [employer].

## 2015-05-17 ENCOUNTER — Telehealth: Payer: Self-pay | Admitting: *Deleted

## 2015-05-17 LAB — URINE CULTURE: Colony Count: >=100000

## 2015-06-09 DIAGNOSIS — L700 Acne vulgaris: Secondary | ICD-10-CM | POA: Diagnosis not present

## 2015-06-09 DIAGNOSIS — D485 Neoplasm of uncertain behavior of skin: Secondary | ICD-10-CM | POA: Diagnosis not present

## 2015-06-09 DIAGNOSIS — D2361 Other benign neoplasm of skin of right upper limb, including shoulder: Secondary | ICD-10-CM | POA: Diagnosis not present

## 2015-08-16 ENCOUNTER — Ambulatory Visit (INDEPENDENT_AMBULATORY_CARE_PROVIDER_SITE_OTHER): Payer: BLUE CROSS/BLUE SHIELD

## 2015-08-16 ENCOUNTER — Encounter: Payer: Self-pay | Admitting: Family Medicine

## 2015-08-16 ENCOUNTER — Ambulatory Visit (INDEPENDENT_AMBULATORY_CARE_PROVIDER_SITE_OTHER): Payer: BLUE CROSS/BLUE SHIELD | Admitting: Family Medicine

## 2015-08-16 VITALS — BP 163/98 | HR 90 | Temp 98.4°F | Ht 64.0 in | Wt 188.5 lb

## 2015-08-16 DIAGNOSIS — R269 Unspecified abnormalities of gait and mobility: Secondary | ICD-10-CM | POA: Diagnosis not present

## 2015-08-16 DIAGNOSIS — M25561 Pain in right knee: Secondary | ICD-10-CM

## 2015-08-16 DIAGNOSIS — M25551 Pain in right hip: Secondary | ICD-10-CM | POA: Diagnosis not present

## 2015-08-16 DIAGNOSIS — M7041 Prepatellar bursitis, right knee: Secondary | ICD-10-CM

## 2015-08-16 DIAGNOSIS — S8991XA Unspecified injury of right lower leg, initial encounter: Secondary | ICD-10-CM | POA: Diagnosis not present

## 2015-08-16 DIAGNOSIS — R209 Unspecified disturbances of skin sensation: Secondary | ICD-10-CM | POA: Diagnosis not present

## 2015-08-16 MED ORDER — DICLOFENAC SODIUM 1 % TD GEL: 4.0000 g | Freq: Four times a day (QID) | TRANSDERMAL | 11 refills | Status: DC

## 2015-08-16 NOTE — Patient Instructions (Signed)
Thank you for coming in today. Apply diclofenac gel 4 times daily for pain. Attend physical therapy. Return if not better.    Prepatellar Bursitis With Rehab  Bursitis is a condition that is characterized by inflammation of a bursa. Saunders Revel exists in many areas of the body. They are fluid-filled sacs that lie between a soft tissue (skin, tendon, or ligament) and a bone, and they reduce friction between the structures as well as the stress placed on the soft tissue. Prepatellar bursitis is inflammation of the bursa that lies between the skin and the kneecap (patella). This condition often causes pain over the patella. SYMPTOMS   Pain, tenderness, and/or inflammation over the patella.  Pain that worsens with movement of the knee joint.  Decreased range of motion for the knee joint.  A crackling sound (crepitation) when the bursa is moved or touched.  Occasionally, painless swelling of the bursa.  Fever (when infected). CAUSES  Bursitis is caused by damage to the bursa, which results in an inflammatory response. Common mechanisms of injury include:  Direct trauma to the front of the knee.  Repetitive and/or stressful use of the knee. RISK INCREASES WITH:  Activities in which kneeling and/or falling on one's knees is likely (volleyball or football).  Repetitive and stressful training, especially if it involves running on hills.  Improper training techniques, such as a sudden increase in the intensity, frequency, or duration of training.  Failure to warm up properly before activity.  Poor technique.  Artificial turf. PREVENTION   Avoid kneeling or falling on your knees.  Warm up and stretch properly before activity.  Allow for adequate recovery between workouts.  Maintain physical fitness:  Strength, flexibility, and endurance.  Cardiovascular fitness.  Learn and use proper technique. When possible, have a coach correct improper technique.  Wear properly fitted and  padded protective equipment (knee pads). PROGNOSIS  If treated properly, then the symptoms of prepatellar bursitis usually resolve within 2 weeks. RELATED COMPLICATIONS   Recurrent symptoms that result in a chronic problem.  Prolonged healing time, if improperly treated or reinjured.  Limited range of motion.  Infection of bursa.  Chronic inflammation or scarring of bursa. TREATMENT  Treatment initially involves the use of ice and medication to help reduce pain and inflammation. The use of strengthening and stretching exercises may help reduce pain with activity, especially those of the quadriceps and hamstring muscles. These exercises may be performed at home or with referral to a therapist. Your caregiver may recommend knee pads when you return to playing sports, in order to reduce the stress on the prepatellar bursa. If symptoms persist despite treatment, then your caregiver may drain fluid out with a needle (aspirate) the bursa. If symptoms persist for greater than 6 months despite nonsurgical (conservative) treatment, then surgery may be recommended to remove the bursa.  MEDICATION  If pain medication is necessary, then nonsteroidal anti-inflammatory medications, such as aspirin and ibuprofen, or other minor pain relievers, such as acetaminophen, are often recommended.  Do not take pain medication for 7 days before surgery.  Prescription pain relievers may be given if deemed necessary by your caregiver. Use only as directed and only as much as you need.  Corticosteroid injections may be given by your caregiver. These injections should be reserved for the most serious cases, because they may only be given a certain number of times. HEAT AND COLD  Cold treatment (icing) relieves pain and reduces inflammation. Cold treatment should be applied for 10 to 15 minutes  every 2 to 3 hours for inflammation and pain and immediately after any activity that aggravates your symptoms. Use ice packs  or massage the area with a piece of ice (ice massage).  Heat treatment may be used prior to performing the stretching and strengthening activities prescribed by your caregiver, physical therapist, or athletic trainer. Use a heat pack or soak the injury in warm water. SEEK MEDICAL CARE IF:  Treatment seems to offer no benefit, or the condition worsens.  Any medications produce adverse side effects. EXERCISES RANGE OF MOTION (ROM) AND STRETCHING EXERCISES - Prepatellar Bursitis These exercises may help you when beginning to rehabilitate your injury. Your symptoms may resolve with or without further involvement from your physician, physical therapist or athletic trainer. While completing these exercises, remember:   Restoring tissue flexibility helps normal motion to return to the joints. This allows healthier, less painful movement and activity.  An effective stretch should be held for at least 30 seconds.  A stretch should never be painful. You should only feel a gentle lengthening or release in the stretched tissue. STRETCH - Hamstrings, Standing  Stand or sit and extend your right / left leg, placing your foot on a chair or foot stool  Keeping a slight arch in your low back and your hips straight forward.  Lead with your chest and lean forward at the waist until you feel a gentle stretch in the back of your right / left knee or thigh. (When done correctly, this exercise requires leaning only a small distance.)  Hold this position for __________ seconds. Repeat __________ times. Complete this stretch __________ times per day. STRETCH - Quadriceps, Prone   Lie on your stomach on a firm surface, such as a bed or padded floor.  Bend your right / left knee and grasp your ankle. If you are unable to reach, your ankle or pant leg, use a belt around your foot to lengthen your reach.  Gently pull your heel toward your buttocks. Your knee should not slide out to the side. You should feel a  stretch in the front of your thigh and/or knee.  Hold this position for __________ seconds. Repeat __________ times. Complete this stretch __________ times per day.  STRETCH - Hamstrings/Adductors, V-Sit   Sit on the floor with your legs extended in a large "V," keeping your knees straight.  With your head and chest upright, bend at your waist reaching for your right foot to stretch your left adductors.  You should feel a stretch in your left inner thigh. Hold for __________ seconds.  Return to the upright position to relax your leg muscles.  Continuing to keep your chest upright, bend straight forward at your waist to stretch your hamstrings.  You should feel a stretch behind both of your thighs and/or knees. Hold for __________ seconds.  Return to the upright position to relax your leg muscles.  Repeat steps 2 through 4. Repeat __________ times. Complete this exercise __________ times per day.  STRENGTHENING EXERCISES - Prepatellar Bursitis  These exercises may help you when beginning to rehabilitate your injury. They may resolve your symptoms with or without further involvement from your physician, physical therapist or athletic trainer. While completing these exercises, remember:  Muscles can gain both the endurance and the strength needed for everyday activities through controlled exercises.  Complete these exercises as instructed by your physician, physical therapist or athletic trainer. Progress the resistance and repetitions only as guided. STRENGTH - Quadriceps, Isometrics  Lie on your  back with your right / left leg extended and your opposite knee bent.  Gradually tense the muscles in the front of your right / left thigh. You should see either your kneecap slide up toward your hip or increased dimpling just above the knee. This motion will push the back of the knee down toward the floor/mat/bed on which you are lying.  Hold the muscle as tight as you can without  increasing your pain for __________ seconds.  Relax the muscles slowly and completely in between each repetition. Repeat __________ times. Complete this exercise __________ times per day.  STRENGTH - Quadriceps, Short Arcs   Lie on your back. Place a __________ inch towel roll under your knee so that the knee slightly bends.  Raise only your lower leg by tightening the muscles in the front of your thigh. Do not allow your thigh to rise.  Hold this position for __________ seconds. Repeat __________ times. Complete this exercise __________ times per day.  OPTIONAL ANKLE WEIGHTS: Begin with ____________________, but DO NOT exceed ____________________. Increase in1 lb/0.5 kg increments.  STRENGTH - Quadriceps, Straight Leg Raises  Quality counts! Watch for signs that the quadriceps muscle is working to insure you are strengthening the correct muscles and not "cheating" by substituting with healthier muscles.  Lay on your back with your right / left leg extended and your opposite knee bent.  Tense the muscles in the front of your right / left thigh. You should see either your kneecap slide up or increased dimpling just above the knee. Your thigh may even quiver.  Tighten these muscles even more and raise your leg 4 to 6 inches off the floor. Hold for __________ seconds.  Keeping these muscles tense, lower your leg.  Relax the muscles slowly and completely in between each repetition. Repeat __________ times. Complete this exercise __________ times per day.  STRENGTH - Quadriceps, Step-Ups   Use a thick book, step or step stool that is __________ inches tall.  Holding a wall or counter for balance only, not support.  Slowly step-up with your right / left foot, keeping your knee in line with your hip and foot. Do not allow your knee to bend so far that you cannot see your toes.  Slowly unlock your knee and lower yourself to the starting position. Your muscles, not gravity, should lower  you. Repeat __________ times. Complete this exercise __________ times per day.   This information is not intended to replace advice given to you by your health care provider. Make sure you discuss any questions you have with your health care provider.   Document Released: 12/18/2004 Document Revised: 09/08/2014 Document Reviewed: 04/01/2008 Elsevier Interactive Patient Education 2016 [employer].    Trochanteric Bursitis You have hip pain due to trochanteric bursitis. Bursitis means that the sack near the outside of the hip is filled with fluid and inflamed. This sack is made up of protective soft tissue. The pain from trochanteric bursitis can be severe and keep you from sleep. It can radiate to the buttocks or down the outside of the thigh to the knee. The pain is almost always worse when rising from the seated or lying position and with walking. Pain can improve after you take a few steps. It happens more often in people with hip joint and lumbar spine problems, such as arthritis or previous surgery. Very rarely the trochanteric bursa can become infected, and antibiotics and/or surgery may be needed. Treatment often includes an injection of local anesthetic  mixed with cortisone medicine. This medicine is injected into the area where it is most tender over the hip. Repeat injections may be necessary if the response to treatment is slow. You can apply ice packs over the tender area for 30 minutes every 2 hours for the next few days. Anti-inflammatory and/or narcotic pain medicine may also be helpful. Limit your activity for the next few days if the pain continues. See your caregiver in 5-10 days if you are not greatly improved.  SEEK IMMEDIATE MEDICAL CARE IF:  You develop severe pain, fever, or increased redness.  You have pain that radiates below the knee. EXERCISES STRETCHING EXERCISES - Trochanteric Bursitis  These exercises may help you when beginning to rehabilitate your injury. Your  symptoms may resolve with or without further involvement from your physician, physical therapist, or athletic trainer. While completing these exercises, remember:   Restoring tissue flexibility helps normal motion to return to the joints. This allows healthier, less painful movement and activity.  An effective stretch should be held for at least 30 seconds.  A stretch should never be painful. You should only feel a gentle lengthening or release in the stretched tissue. STRETCH - Iliotibial Band  On the floor or bed, lie on your side so your injured leg is on top. Bend your knee and grab your ankle.  Slowly bring your knee back so that your thigh is in line with your trunk. Keep your heel at your buttocks and gently arch your back so your head, shoulders and hips line up.  Slowly lower your leg so that your knee approaches the floor/bed until you feel a gentle stretch on the outside of your thigh. If you do not feel a stretch and your knee will not fall farther, place the heel of your opposite foot on top of your knee and pull your thigh down farther.  Hold this stretch for __________ seconds.  Repeat __________ times. Complete this exercise __________ times per day. STRETCH - Hamstrings, Supine   Lie on your back. Loop a belt or towel over the ball of your foot as shown.  Straighten your knee and slowly pull on the belt to raise your injured leg. Do not allow the knee to bend. Keep your opposite leg flat on the floor.  Raise the leg until you feel a gentle stretch behind your knee or thigh. Hold this position for __________ seconds.  Repeat __________ times. Complete this stretch __________ times per day. STRETCH - Quadriceps, Prone   Lie on your stomach on a firm surface, such as a bed or padded floor.  Bend your knee and grasp your ankle. If you are unable to reach your ankle or pant leg, use a belt around your foot to lengthen your reach.  Gently pull your heel toward your  buttocks. Your knee should not slide out to the side. You should feel a stretch in the front of your thigh and/or knee.  Hold this position for __________ seconds.  Repeat __________ times. Complete this stretch __________ times per day. STRETCHING - Hip Flexors, Lunge Half kneel with your knee on the floor and your opposite knee bent and directly over your ankle.  Keep good posture with your head over your shoulders. Tighten your buttocks to point your tailbone downward; this will prevent your back from arching too much.  You should feel a gentle stretch in the front of your thigh and/or hip. If you do not feel any resistance, slightly slide your opposite foot  forward and then slowly lunge forward so your knee once again lines up over your ankle. Be sure your tailbone remains pointed downward.  Hold this stretch for __________ seconds.  Repeat __________ times. Complete this stretch __________ times per day. STRETCH - Adductors, Lunge  While standing, spread your legs.  Lean away from your injured leg by bending your opposite knee. You may rest your hands on your thigh for balance.  You should feel a stretch in your inner thigh. Hold for __________ seconds.  Repeat __________ times. Complete this exercise __________ times per day.   This information is not intended to replace advice given to you by your health care provider. Make sure you discuss any questions you have with your health care provider.   Document Released: 01/26/2004 Document Revised: 05/04/2014 Document Reviewed: 04/01/2008 Elsevier Interactive Patient Education [employer].

## 2015-08-16 NOTE — Progress Notes (Signed)
Subjective:    I'm seeing this patient as a consultation for:  Emeterio Reeve, DO   CC: Right knee injury  HPI: Patient hit her right knee On the dashboard of her car very hard yesterday. She notes anterior knee pain, lateral and anterior right hip pain, and paresthesias into her foot. Symptoms are moderate to severe and interfering with walking. She notes the knee pain is worse with knee extension climbing stairs or kneeling and sitting. The right anterior hip pain is worse with hip motion. She notes the paresthesias she describes as tingling into the plantar foot along the medial aspect of her leg does not seem to be worse or better with any activities and seems to be mild and improving since yesterday. She denies any back pain weakness or loss of function. She's tried some over-the-counter medicines for pain which have helped only a little.  Past medical history, Surgical history, Family history not pertinant except as noted below, Social history, Allergies, and medications have been entered into the medical record, reviewed, and no changes needed.   Review of Systems: No headache, visual changes, nausea, vomiting, diarrhea, constipation, dizziness, abdominal pain, skin rash, fevers, chills, night sweats, weight loss, swollen lymph nodes, body aches, joint swelling, muscle aches, chest pain, shortness of breath, mood changes, visual or auditory hallucinations.   Objective:    Vitals:   08/16/15 1108  BP: (!) 163/98  Pulse: 90  Temp: 98.4 F (36.9 C)   General: Well Developed, well nourished, and in no acute distress.  Neuro/Psych: Alert and oriented x3, extra-ocular muscles intact, able to move all 4 extremities, sensation grossly intact. Skin: Warm and dry, no rashes noted.  Respiratory: Not using accessory muscles, speaking in full sentences, trachea midline.  Cardiovascular: Pulses palpable, no extremity edema. Abdomen: Does not appear distended. MSK:  Back: Nontender to  spinal midline. Normal back motion. Negative slump test bilaterally. Right hip normal-appearing. Tender to palpation right greater trochanter. Normal motion of her pain with internal motion of the hip is felt in the groin. Right knee swollen and tender overlying the patella. No skin changes. No effusion. Normal knee motion. Stable ligamentous exam. Normal strength. Foot and ankle normal-appearing nontender. Sensation is subjectively decreased into the medial plantar foot the right compared to the left.  Pulses and capillary refill are intact distal bilateral lower extremities.   No results found for this or any previous visit (from the past 24 hour(s)). Dg Knee 1-2 Views Left  Result Date: 08/16/2015 CLINICAL DATA:  Right groin pain. Hit right knee on dashboard getting into car last night. EXAM: LEFT KNEE - 1-2 VIEW COMPARISON:  Right knee performed today. FINDINGS: AP view of the left knee demonstrates no fracture, subluxation or dislocation. Joint spaces appear maintained. IMPRESSION: Negative. Electronically Signed   By: Rolm Baptise M.D.   On: 08/16/2015 11:46   Dg Knee Complete 4 Views Right  Result Date: 08/16/2015 CLINICAL DATA:  Right groin pain. Hit right knee on dashboard getting into car last night. EXAM: RIGHT KNEE - COMPLETE 4+ VIEW COMPARISON:  None. FINDINGS: No evidence of fracture, dislocation, or joint effusion. No evidence of arthropathy or other focal bone abnormality. Soft tissues are unremarkable. IMPRESSION: Negative. Electronically Signed   By: Rolm Baptise M.D.   On: 08/16/2015 11:47   Dg Hip Unilat With Pelvis 2-3 Views Right  Result Date: 08/16/2015 CLINICAL DATA:  Right groin pain. Hit right knee on dashboard getting into car last night. EXAM: DG HIP (WITH  OR WITHOUT PELVIS) 2-3V RIGHT COMPARISON:  None. 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 SI joints are symmetric and unremarkable. No acute bony  abnormality. Specifically, no fracture, subluxation, or dislocation. Soft tissues are intact. 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 No acute bony abnormality. Electronically Signed   By: Rolm Baptise M.D.   On: 08/16/2015 11:45    Impression and Recommendations:    Assessment and Plan: 50 y.o. female with  Anterior knee injury with the following  1) right prepatellar bursitis: Treat with diclofenac gel and physical therapy. 2) right lateral hip abductor tendinitis due to limp. Treat with physical therapy and home exercise program. 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 Again treat with physical therapy  Recheck in a few weeks if not better.   Discussed warning signs or symptoms. Please see discharge instructions. Patient expresses understanding.   CC: Emeterio Reeve, DO

## 2015-08-16 NOTE — Progress Notes (Signed)
Pt hit knee against dash of SUV last night.  As soon as it happened, had numbness from knee to hip and tingling from knee to ankle.  Pain level 7 now, has taken tylenol for pain.

## 2015-08-18 ENCOUNTER — Telehealth: Payer: Self-pay | Admitting: *Deleted

## 2015-08-18 NOTE — Telephone Encounter (Signed)
Initiated a PA though covermymed  Key: [id number]

## 2015-08-24 NOTE — Telephone Encounter (Signed)
Diclofenac has been denied by insurance. Per Dr. Georgina Snell called and left a vm for the patient that the cash pay price is $30 if she wanted to still pick the medication up.

## 2015-09-05 ENCOUNTER — Encounter: Payer: Self-pay | Admitting: *Deleted

## 2015-09-05 ENCOUNTER — Emergency Department
Admission: EM | Admit: 2015-09-05 | Discharge: 2015-09-05 | Disposition: A | Discharge status: Home / Self Care | Payer: BLUE CROSS/BLUE SHIELD | Source: Home / Self Care | Attending: Family Medicine | Admitting: Family Medicine

## 2015-09-05 DIAGNOSIS — R300 Dysuria: Secondary | ICD-10-CM

## 2015-09-05 DIAGNOSIS — R109 Unspecified abdominal pain: Secondary | ICD-10-CM

## 2015-09-05 DIAGNOSIS — R197 Diarrhea, unspecified: Secondary | ICD-10-CM

## 2015-09-05 DIAGNOSIS — R110 Nausea: Secondary | ICD-10-CM

## 2015-09-05 DIAGNOSIS — J029 Acute pharyngitis, unspecified: Secondary | ICD-10-CM

## 2015-09-05 LAB — POCT CBC W AUTO DIFF (K'VILLE URGENT CARE)

## 2015-09-05 LAB — POCT URINALYSIS DIP (MANUAL ENTRY)
BILIRUBIN UA: NEGATIVE
Blood, UA: NEGATIVE
GLUCOSE UA: NEGATIVE
Ketones, POC UA: NEGATIVE
Leukocytes, UA: NEGATIVE
Nitrite, UA: NEGATIVE
Protein Ur, POC: NEGATIVE
SPEC GRAV UA: 1.01 (ref 1.005–1.03)
UROBILINOGEN UA: 0.2 (ref 0–1)
pH, UA: 5 (ref 5–8)

## 2015-09-05 MED ORDER — CEFDINIR 300 MG PO CAPS: 300.0000 mg | ORAL_CAPSULE | Freq: Two times a day (BID) | ORAL | 0 refills | Status: AC

## 2015-09-05 NOTE — ED Triage Notes (Addendum)
Pt c/o dysuria, lower abd pain and pressure, and dark colored urine x 1 wk. She reports diarrhea and white spots in her mouth x 1 day. She has taken minocycline 100mg  BID x 4-5 days for acne.

## 2015-09-05 NOTE — ED Provider Notes (Signed)
CSN: [id number]     Arrival date & time 09/05/15  0803 History   First MD Initiated Contact with Patient 09/05/15 [phone]     Chief Complaint  Patient presents with  . Dysuria   (Consider location/radiation/quality/duration/timing/severity/associated sxs/prior Treatment) HPI Bonnie Peck is a 50 y.o. female presenting to UC with c/o dysuria, lower and Left sided abdominal pain and pressure, and dark urine.  Symptoms started 1 week ago.  Pt reports having diarrhea, loose to watery stools since last night about 6-7 episodes in the last 24 hours.  She reports scant amount of red blood in stool.  She also reports having a scratchy throat with red and white spots since yesterday.  She has started taking cranberry supplements for her urinary symptoms w/o relief. She also reports starting on minocycline as her face was breaking out last week while she was in [place].  Pt believes she may have gotten sick from airplane food or from the hotel they were staying in.  Pt's husband is with her today but denies any symptoms.  Denies fever, chills, or vomiting but has had some nausea. No hx of diverticulitis. 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    BP elevated. Pt has hx of HTN, pt notes she is monitoring at home along with her PCP. Denies headache, chest pain or SOB. Denies dizziness.  Past Medical History:  Diagnosis Date  . Hypertension    Past Surgical History:  Procedure Laterality Date  . APPENDECTOMY  age 64  . CESAREAN SECTION  1998  . KIDNEY SURGERY Left born   . RHINOPLASTY    . UTERINE SEPTUM RESECTION     Family History  Problem Relation Age of Onset  . Melanoma Maternal Grandmother   . Heart attack Maternal Grandmother   . Heart attack Mother   . Diabetes Mother   . Hypertension Mother   . Hyperlipidemia Father   . Colon cancer Father    Social History  Substance Use Topics  . Smoking status: Never Smoker  . Smokeless tobacco: Never Used  . Alcohol use Yes   OB History    Obstetric  Comments   First menstrual 13     Review of Systems  Constitutional: Negative for appetite change, chills, diaphoresis, fatigue and fever.  HENT: Negative for congestion, rhinorrhea and sore throat.   Respiratory: Negative for cough and wheezing.   Gastrointestinal: Positive for abdominal pain, blood in stool, diarrhea and nausea. Negative for constipation, rectal pain and vomiting.  Genitourinary: Positive for dysuria, hematuria ( "dark urine"), pelvic pain ( pressure, discomfort) and urgency. Negative for decreased urine volume, flank pain, frequency, vaginal bleeding, vaginal discharge and vaginal pain.  Musculoskeletal: Negative for back pain and myalgias.    Allergies  Vancomycin; Ciprofloxacin; Corticosteroids; Erythromycin; Penicillins; and Sulfa antibiotics  Home Medications   Prior to Admission medications   Medication Sig Start Date End Date Taking? Authorizing Provider  CRANBERRY PO Take by mouth.   Yes Historical Provider, MD  minocycline (MINOCIN,DYNACIN) 100 MG capsule Take 100 mg by mouth 2 (two) times daily.   Yes Historical Provider, MD  cefdinir (OMNICEF) 300 MG capsule Take 1 capsule (300 mg total) by mouth 2 (two) times daily. 09/05/15 09/12/15  Noland Fordyce, PA-C  diclofenac sodium (VOLTAREN) 1 % GEL Apply 4 g topically 4 (four) times daily. To affected joint. 08/16/15   Gregor Hams, MD  L-ARGININE PO Take by mouth.    Historical Provider, MD   Meds Ordered and Administered this  Visit  Medications - No data to display  BP (!) 177/127 (BP Location: Left Arm)   Pulse 87   Temp 98.9 F (37.2 C) (Oral)   Resp 16   Ht 5\' 4"  (1.626 m)   Wt 187 lb (84.8 kg)   LMP 08/21/2015 Comment: Patient states that she thinks she is starting to go through menopause  SpO2 100%   BMI 32.10 kg/m  No data found.   Physical Exam  Constitutional: She appears well-developed and well-nourished. No distress.  HENT:  Head: Normocephalic and atraumatic.  Eyes: Conjunctivae are  normal. No scleral icterus.  Neck: Normal range of motion.  Cardiovascular: Normal rate, regular rhythm and normal heart sounds.   Pulmonary/Chest: Effort normal and breath sounds normal. No respiratory distress. She has no wheezes. She has no rales. She exhibits no tenderness.  Abdominal: Soft. Bowel sounds are normal. She exhibits no distension and no mass. There is tenderness. There is guarding. There is no rebound.  Soft, non-distended. Tenderness to Left side of abdomen. Some guarding to LUQ. No CVA tenderness.   Musculoskeletal: Normal range of motion.  Neurological: She is alert.  Skin: Skin is warm and dry. She is not diaphoretic.  Nursing note and vitals reviewed.   Urgent Care Course   Clinical Course    Procedures (including critical care time)  Labs Review Labs Reviewed  URINE CULTURE  COMPLETE METABOLIC PANEL WITH GFR  POCT URINALYSIS DIP (MANUAL ENTRY)  POCT CBC W AUTO DIFF ([facility])    Imaging Review No results found.   MDM   1. Dysuria   2. Left sided abdominal pain   3. Diarrhea, unspecified type   4. Nausea   5. Sore throat     Pt c/o abdominal pain, nausea, diarrhea, dysuria, and bladder pressure.  Tenderness to abdomen. Pt is afebrile, appears well. No vomiting.  UA: unremarkable  CBC: WNL CMP and Urine culture: pending  Discussed labs with pt. Advised pt she may have diverticulitis, however, CT scan is the only way to make a definitive dx.  Pt notes her husband recently had a CT and is concerned about cost.  Pt wants to hold off on scan at this time.  Unfortunately, pt has allergy to Augmentin and Cipro, which could be used for empiric treatment of clinical diverticulitis.  Pt has been on keflex for UTI, notes "it does not work" but was on cefdinir for last UTI, which did help.  Prescription to hold for Cefdinir as urine culture is pending.  Advised pt antibiotic could worsen GI symptoms, should hold off if urine culture comes back  normal. F/u with PCP if symptoms not improving. Go to emergency department if abdominal pain worsens. Patient verbalized understanding and agreement with treatment plan.    Noland Fordyce, PA-C 09/05/15 1030

## 2015-09-05 NOTE — Discharge Instructions (Signed)
°  Please follow up with your primary care provider for recheck of symptoms in about 3 days if symptoms not improving, especially if your urine culture comes back Normal as taking antibiotics that are NOT indicated can worsen diarrhea, nausea, and vomiting.

## 2015-09-07 ENCOUNTER — Ambulatory Visit: Payer: BLUE CROSS/BLUE SHIELD | Admitting: Family Medicine

## 2015-09-07 DIAGNOSIS — M79671 Pain in right foot: Secondary | ICD-10-CM | POA: Diagnosis not present

## 2015-09-07 LAB — URINE CULTURE: ORGANISM ID, BACTERIA: NO GROWTH

## 2015-09-07 LAB — COMPLETE METABOLIC PANEL WITH GFR
ALT: 17 U/L (ref 6–29)
AST: 17 U/L (ref 10–35)
Albumin: 4.3 g/dL (ref 3.6–5.1)
Alkaline Phosphatase: 72 U/L (ref 33–115)
BUN: 10 mg/dL (ref 7–25)
CO2: 24 mmol/L (ref 20–31)
Calcium: 9 mg/dL (ref 8.6–10.2)
Chloride: 104 mmol/L (ref 98–110)
Creat: 0.83 mg/dL (ref 0.50–1.10)
GFR, Est African American: >89 mL/min (ref >=60)
GFR, Est Non African American: 84 mL/min (ref >=60)
Glucose, Bld: 93 mg/dL (ref 65–99)
Potassium: 4.2 mmol/L (ref 3.5–5.3)
Sodium: 139 mmol/L (ref 135–146)
Total Bilirubin: 0.5 mg/dL (ref 0.2–1.2)
Total Protein: 6.8 g/dL (ref 6.1–8.1)

## 2015-09-08 ENCOUNTER — Telehealth: Payer: Self-pay | Admitting: Emergency Medicine

## 2015-09-08 NOTE — Telephone Encounter (Signed)
Hope she is feeling better, labs normal

## 2015-09-12 ENCOUNTER — Ambulatory Visit: Payer: BLUE CROSS/BLUE SHIELD | Admitting: Family Medicine

## 2015-09-23 DIAGNOSIS — Z8249 Family history of ischemic heart disease and other diseases of the circulatory system: Secondary | ICD-10-CM | POA: Diagnosis not present

## 2015-09-23 DIAGNOSIS — E785 Hyperlipidemia, unspecified: Secondary | ICD-10-CM | POA: Diagnosis not present

## 2015-09-23 DIAGNOSIS — D1611 Benign neoplasm of short bones of right upper limb: Secondary | ICD-10-CM | POA: Diagnosis not present

## 2015-09-23 DIAGNOSIS — Z888 Allergy status to other drugs, medicaments and biological substances status: Secondary | ICD-10-CM | POA: Diagnosis not present

## 2015-09-23 DIAGNOSIS — M7661 Achilles tendinitis, right leg: Secondary | ICD-10-CM | POA: Diagnosis not present

## 2015-09-23 DIAGNOSIS — E663 Overweight: Secondary | ICD-10-CM | POA: Diagnosis not present

## 2015-09-23 DIAGNOSIS — S86011A Strain of right Achilles tendon, initial encounter: Secondary | ICD-10-CM | POA: Diagnosis not present

## 2015-09-23 DIAGNOSIS — Z6831 Body mass index (BMI) 31.0-31.9, adult: Secondary | ICD-10-CM | POA: Diagnosis not present

## 2015-09-23 DIAGNOSIS — Z880 Allergy status to penicillin: Secondary | ICD-10-CM | POA: Diagnosis not present

## 2015-09-23 DIAGNOSIS — M66871 Spontaneous rupture of other tendons, right ankle and foot: Secondary | ICD-10-CM | POA: Diagnosis not present

## 2015-09-23 DIAGNOSIS — M67441 Ganglion, right hand: Secondary | ICD-10-CM | POA: Diagnosis not present

## 2015-09-23 DIAGNOSIS — N189 Chronic kidney disease, unspecified: Secondary | ICD-10-CM | POA: Diagnosis not present

## 2015-09-23 DIAGNOSIS — M7731 Calcaneal spur, right foot: Secondary | ICD-10-CM | POA: Diagnosis not present

## 2015-09-23 DIAGNOSIS — Z833 Family history of diabetes mellitus: Secondary | ICD-10-CM | POA: Diagnosis not present

## 2015-09-23 DIAGNOSIS — K219 Gastro-esophageal reflux disease without esophagitis: Secondary | ICD-10-CM | POA: Diagnosis not present

## 2015-09-23 DIAGNOSIS — Z79899 Other long term (current) drug therapy: Secondary | ICD-10-CM | POA: Diagnosis not present

## 2015-09-23 DIAGNOSIS — M1990 Unspecified osteoarthritis, unspecified site: Secondary | ICD-10-CM | POA: Diagnosis not present

## 2015-09-23 DIAGNOSIS — I129 Hypertensive chronic kidney disease with stage 1 through stage 4 chronic kidney disease, or unspecified chronic kidney disease: Secondary | ICD-10-CM | POA: Diagnosis not present

## 2015-09-23 DIAGNOSIS — M9261 Juvenile osteochondrosis of tarsus, right ankle: Secondary | ICD-10-CM | POA: Diagnosis not present

## 2015-09-23 DIAGNOSIS — Z882 Allergy status to sulfonamides status: Secondary | ICD-10-CM | POA: Diagnosis not present

## 2015-09-30 DIAGNOSIS — Z4789 Encounter for other orthopedic aftercare: Secondary | ICD-10-CM | POA: Diagnosis not present

## 2015-11-07 DIAGNOSIS — M9261 Juvenile osteochondrosis of tarsus, right ankle: Secondary | ICD-10-CM | POA: Diagnosis not present

## 2015-11-07 DIAGNOSIS — R6889 Other general symptoms and signs: Secondary | ICD-10-CM | POA: Diagnosis not present

## 2015-11-07 DIAGNOSIS — M79671 Pain in right foot: Secondary | ICD-10-CM | POA: Diagnosis not present

## 2015-11-18 DIAGNOSIS — R6889 Other general symptoms and signs: Secondary | ICD-10-CM | POA: Diagnosis not present

## 2015-11-18 DIAGNOSIS — M79671 Pain in right foot: Secondary | ICD-10-CM | POA: Diagnosis not present

## 2016-03-10 DIAGNOSIS — Z1231 Encounter for screening mammogram for malignant neoplasm of breast: Secondary | ICD-10-CM | POA: Diagnosis not present

## 2016-05-16 DIAGNOSIS — M79644 Pain in right finger(s): Secondary | ICD-10-CM | POA: Diagnosis not present

## 2016-09-24 DIAGNOSIS — M24041 Loose body in right finger joint(s): Secondary | ICD-10-CM | POA: Diagnosis not present

## 2016-09-24 DIAGNOSIS — M79644 Pain in right finger(s): Secondary | ICD-10-CM | POA: Diagnosis not present

## 2016-09-24 DIAGNOSIS — M67441 Ganglion, right hand: Secondary | ICD-10-CM | POA: Diagnosis not present

## 2016-10-17 DIAGNOSIS — H6983 Other specified disorders of Eustachian tube, bilateral: Secondary | ICD-10-CM | POA: Diagnosis not present

## 2016-10-17 DIAGNOSIS — J343 Hypertrophy of nasal turbinates: Secondary | ICD-10-CM | POA: Diagnosis not present

## 2016-10-17 DIAGNOSIS — R0982 Postnasal drip: Secondary | ICD-10-CM | POA: Diagnosis not present

## 2016-10-17 DIAGNOSIS — J310 Chronic rhinitis: Secondary | ICD-10-CM | POA: Diagnosis not present

## 2016-10-17 DIAGNOSIS — H9313 Tinnitus, bilateral: Secondary | ICD-10-CM | POA: Diagnosis not present

## 2016-11-14 IMAGING — US US RENAL
1 series · 14 of 25 positions shown · non-contrast
Comparison: None.

CLINICAL DATA: Right flank pain

EXAM:
RENAL / URINARY TRACT ULTRASOUND COMPLETE

[Series 1: us renal · 0.22mm/px · 14 of 35 slices shown]
[im 1/35]
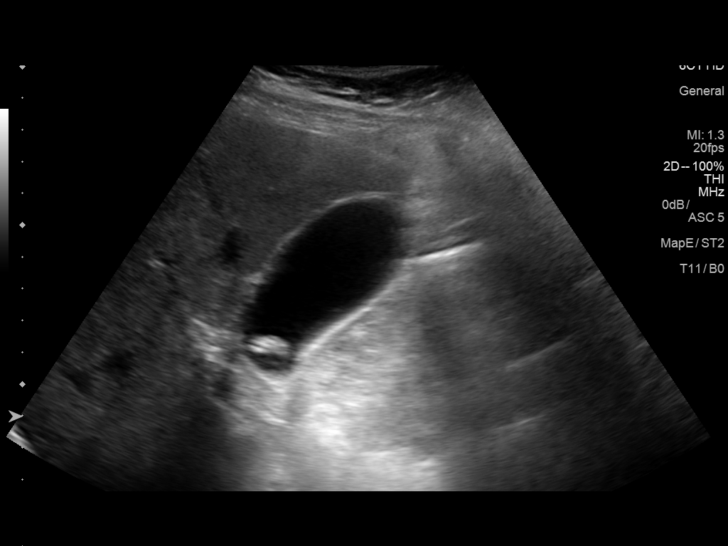
[im 3/35]
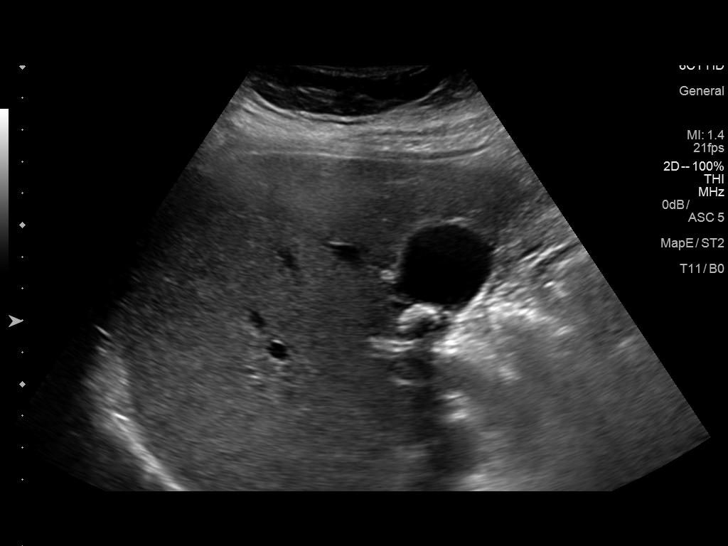
[im 6/35]
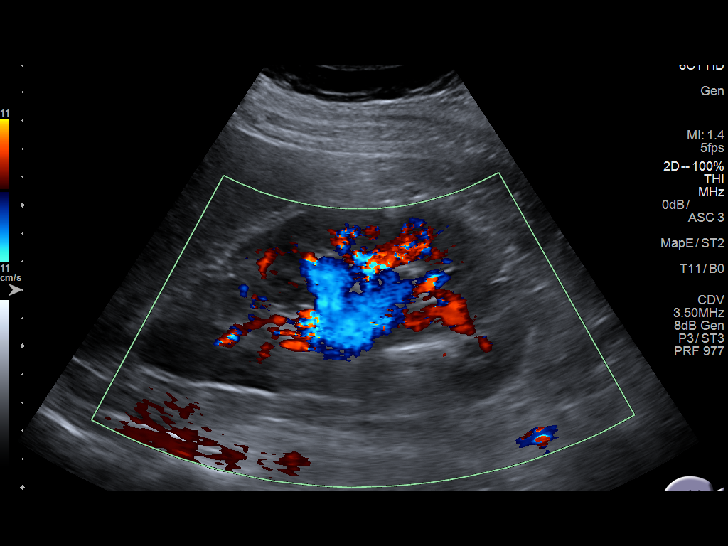
[im 9/35]
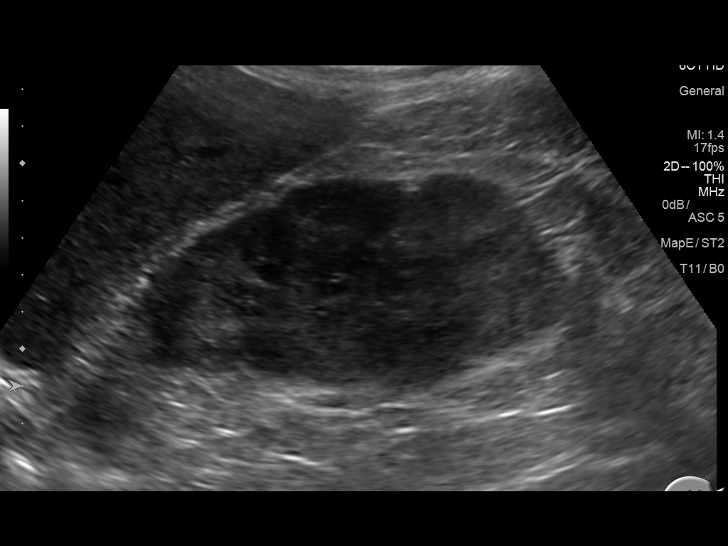
[im 12/35]
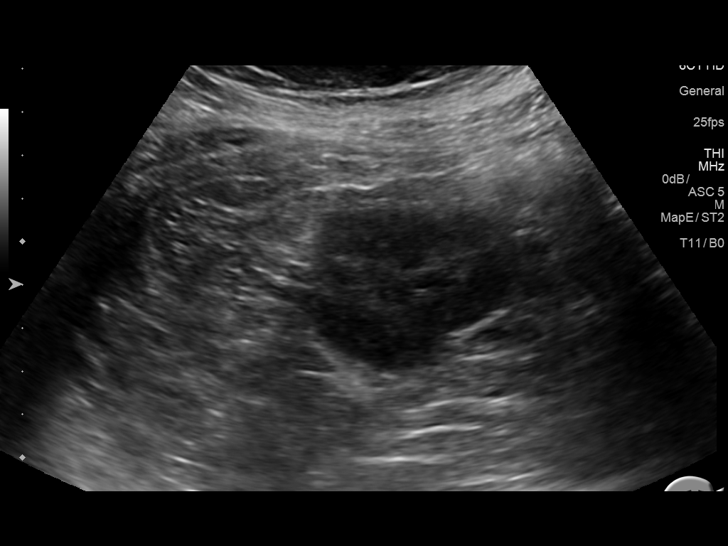
[im 13/35]
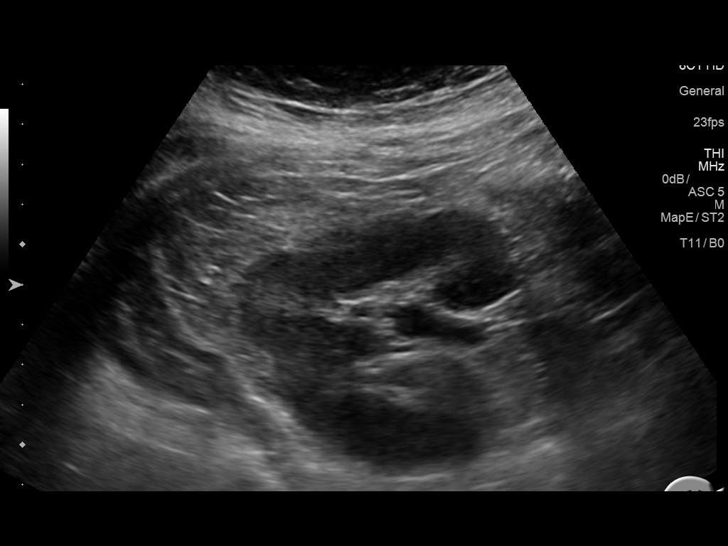
[im 16/35]
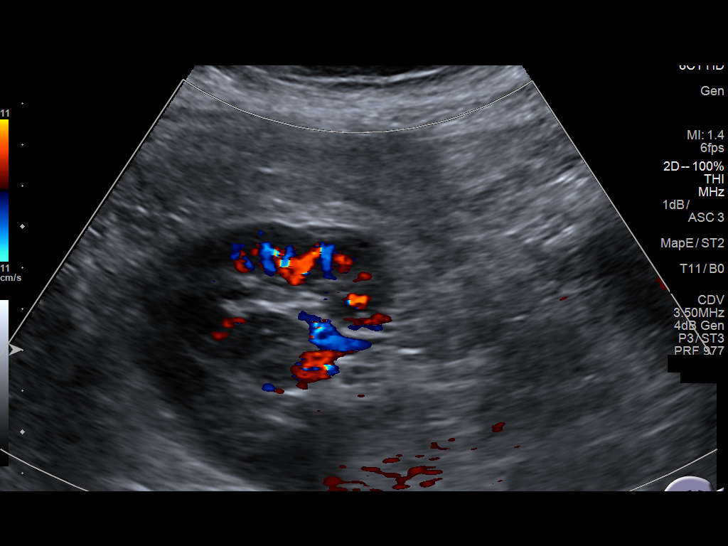
[im 19/35]
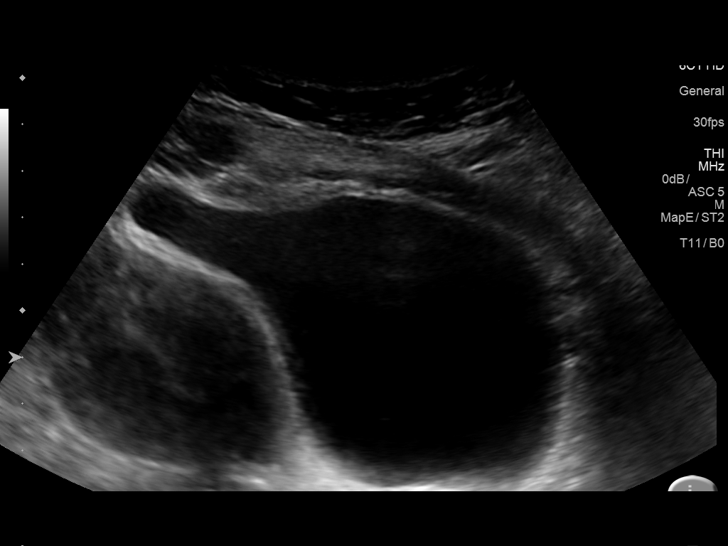
[im 22/35]
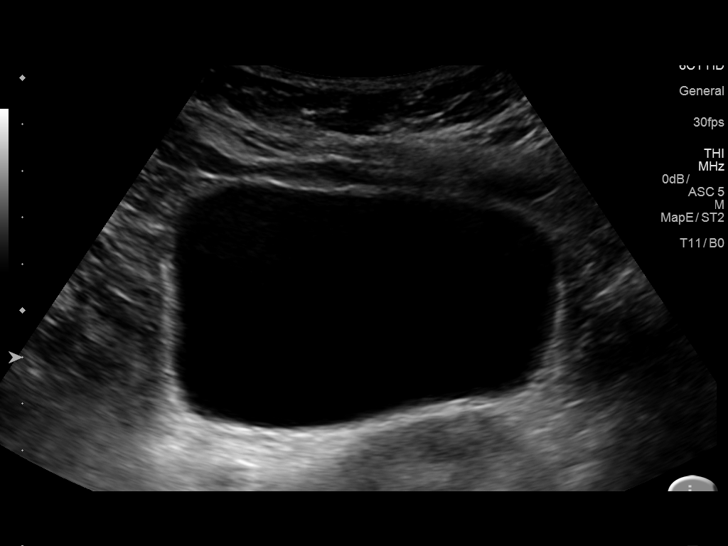
[im 23/35]
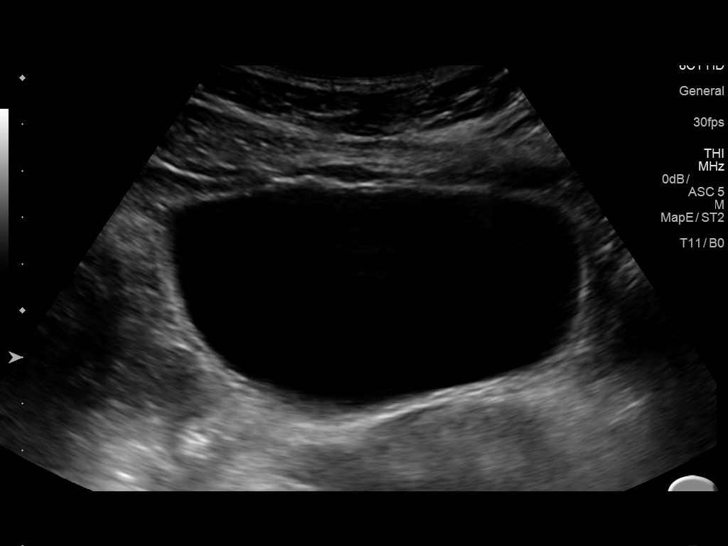
[im 26/35]
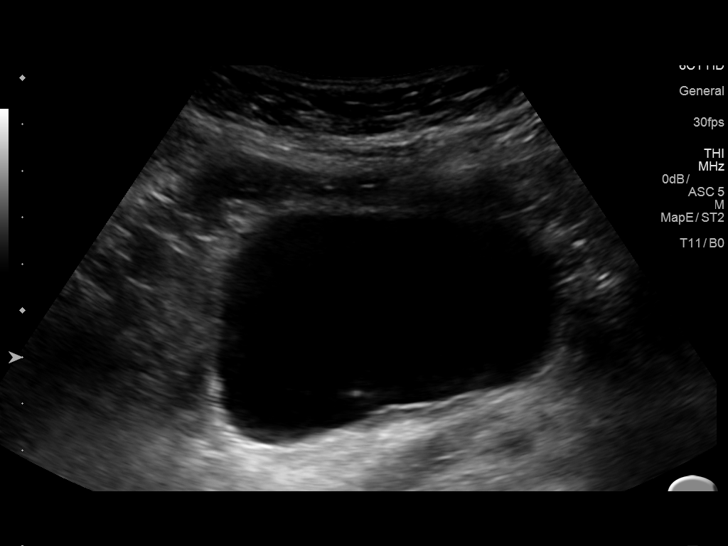
[im 29/35]
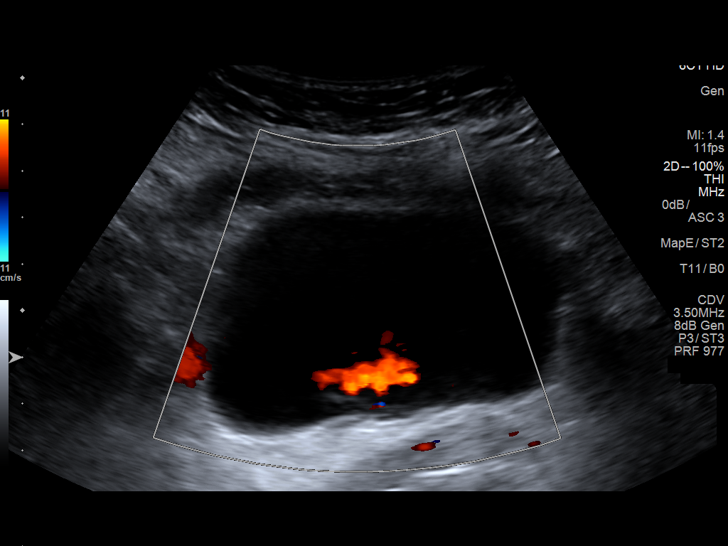
[im 32/35]
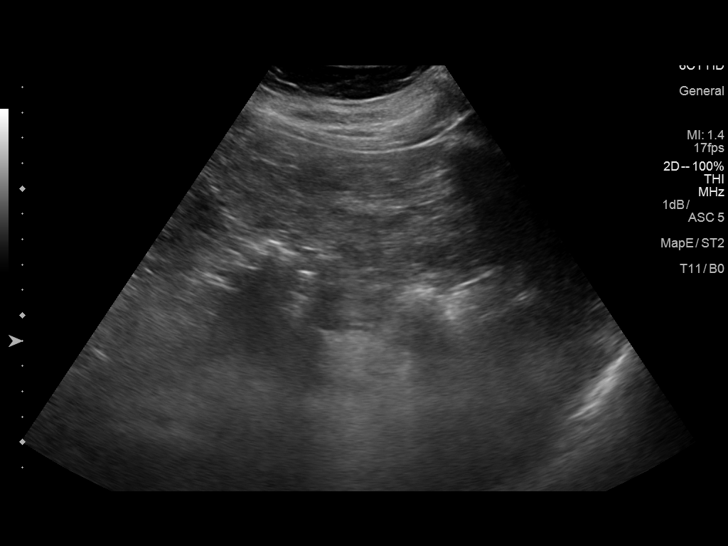
[im 35/35]
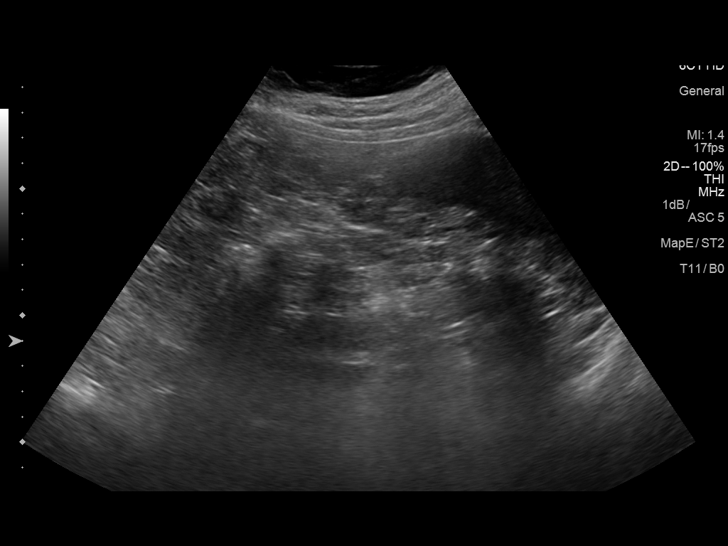

[14 of 25 positions shown; findings below may reference images not displayed]

FINDINGS: Right Kidney:

Length: 14.9 cm. The central portion of the kidney there is a 2.9 x
1.9 x 2.1 cm area of relative isoechoic soft tissue. This may simply
represent a prominent column of Bertin although the possibility of
an underlying lesion cannot be totally excluded. CT of the kidneys
with and without contrast is recommended for further evaluation.

Left Kidney:

Congenitally absent

Bladder:

Appears normal for degree of bladder distention.

Note is made of cholelithiasis without complicating factors.
IMPRESSION: Cholelithiasis.

Questionable area in the midportion of the right kidney as
described. CT of the abdomen with and without contrast is
recommended for further evaluation.

## 2016-11-16 ENCOUNTER — Encounter: Payer: Self-pay | Admitting: Osteopathic Medicine

## 2016-11-16 ENCOUNTER — Ambulatory Visit: Payer: BLUE CROSS/BLUE SHIELD | Admitting: Osteopathic Medicine

## 2016-11-16 VITALS — BP 165/99 | HR 94 | Ht 64.0 in | Wt 193.0 lb

## 2016-11-16 DIAGNOSIS — R1011 Right upper quadrant pain: Secondary | ICD-10-CM | POA: Diagnosis not present

## 2016-11-16 DIAGNOSIS — R221 Localized swelling, mass and lump, neck: Secondary | ICD-10-CM

## 2016-11-16 DIAGNOSIS — Z13220 Encounter for screening for lipoid disorders: Secondary | ICD-10-CM

## 2016-11-16 DIAGNOSIS — N951 Menopausal and female climacteric states: Secondary | ICD-10-CM

## 2016-11-16 DIAGNOSIS — R946 Abnormal results of thyroid function studies: Secondary | ICD-10-CM | POA: Diagnosis not present

## 2016-11-16 NOTE — Progress Notes (Signed)
HPI: Bonnie Peck is a 51 y.o. female who  has a past medical history of Hypertension.  she presents to [facility] today, 11/16/16,  for chief complaint of:  Chief Complaint  Patient presents with  . Other    neck stiffness and mild swelling    Pt reports feeling of neck fullness anteriorly in area of thyroid, globus/choking sensation and bad heartburn starting 5 days ago. Heartburn has gotten better with naturopathic treatment. Globus sensation remains, though no choking solid/liquids. No hx thyroid disease. No fever/chills, no headache, no numbness in arms or weakness, no pain at back of neck.   Reports RUQ pain on and off, worse recently, w/ loose stools. Eats fatty foods frequently, No hx GB disease known. Usually she has constipation.   Perimenopause, would like to know hormone levels.   Reluctant to take prescription medications.     Patient is accompanied by husband who assists with history-taking.   Past medical, surgical, social and family history reviewed:  Patient Active Problem List   Diagnosis Date Noted  . Prepatellar bursitis of right knee 08/16/2015  . Hematuria 04/19/2015  . Essential hypertension 03/11/2015  . 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 APPENDECTOMY  age 27  . CESAREAN SECTION  1998  . KIDNEY SURGERY Left born   . RHINOPLASTY    . UTERINE SEPTUM RESECTION      Social History   Tobacco Use  . Smoking status: Never Smoker  . Smokeless tobacco: Never Used  Substance Use Topics  . Alcohol use: Yes    Family History  Problem Relation Age of Onset  . Melanoma Maternal Grandmother   . Heart attack Maternal Grandmother   . Heart attack Mother   . Diabetes Mother   . Hypertension Mother   . Hyperlipidemia Father   . Colon cancer Father      Current medication list and allergy/intolerance information reviewed:    Current Outpatient  Medications  Medication Sig Dispense Refill  . cyanocobalamin 100 MCG tablet Take 100 mcg daily by mouth.    . Probiotic Product (PRO-FLORA IMMUNE) CAPS Take by mouth.    . Rutin (RUTIN-50) 50 MG TABS Take daily by mouth.    . CRANBERRY PO Take by mouth.    . diclofenac sodium (VOLTAREN) 1 % GEL Apply 4 g topically 4 (four) times daily. To affected joint. (Patient not taking: Reported on 11/16/2016) 100 g 11  . L-ARGININE PO Take by mouth.    . minocycline (MINOCIN,DYNACIN) 100 MG capsule Take 100 mg by mouth 2 (two) times daily.     No current facility-administered medications for this visit.     Allergies  Allergen Reactions  . Vancomycin Hives  . Ciprofloxacin   . Corticosteroids Hives  . Erythromycin   . Penicillins Hives    Can take Keflex without problems  . Sulfa Antibiotics Hives      Review of Systems:  Constitutional:  No  fever, no chills, No recent illness, No unintentional weight changes. No significant fatigue.   HEENT: No  headache, no vision change, no hearing change, No sore throat, No  sinus pressure  Cardiac: No  chest pain, No  pressure, No palpitations  Respiratory:  No  shortness of breath. No  Cough  Gastrointestinal: +abdominal pain, No  nausea, No  vomiting,  No  blood in stool, +diarrhea, +constipation earlier   Musculoskeletal: No new myalgia/arthralgia  Skin: No  Rash,  Endocrine: No cold intolerance,  No heat intolerance. No polyuria/polydipsia/polyphagia   Neurologic: No  weakness, No  dizziness, No  slurred speech/focal weakness/facial droop  Psychiatric: No  concerns with depression, No  concerns with anxiety, No sleep problems, No mood problems  Exam:  BP (!) 165/99   Pulse 94   Ht 5\' 4"  (1.626 m)   Wt 193 lb (87.5 kg)   BMI 33.13 kg/m   Constitutional: VS see above. General Appearance: alert, well-developed, well-nourished, NAD  Eyes: Normal lids and conjunctive, non-icteric sclera  Ears, Nose, Mouth, Throat: MMM, Normal  external inspection ears/nares/mouth/lips/gums.  Pharynx/tonsils no erythema, no exudate. Nasal mucosa normal.   Neck: No masses, trachea midline. No thyroid enlargement/mass appreciated. Tenderness to thryoid gland anteriorly more on L side, though R side feels mildly boggy/edematous, no fluctuance No lymphadenopathy  Respiratory: Normal respiratory effort. no wheeze, no rhonchi, no rales  Cardiovascular: S1/S2 normal, no murmur, no rub/gallop auscultated. RRR. No lower extremity edema.   Gastrointestinal: (+)TTP RUQ, no masses. No hepatomegaly, no splenomegaly. No hernia appreciated. Bowel sounds normal. Rectal exam deferred.   Musculoskeletal: Gait normal. No clubbing/cyanosis of digits.   Neurological: Normal balance/coordination. No tremor. No cranial nerve deficit on limited exam. Motor and sensation intact and symmetric. Cerebellar reflexes intact.   Skin: warm, dry, intact. No rash/ulcer. No concerning nevi or subq nodules on limited exam.    Psychiatric: Normal judgment/insight. Normal mood and affect. Oriented x3.      ASSESSMENT/PLAN: Thyroiditis seems likely, GB disease as well based on exam. Nothing concerning for abscess or lymphadenopathy at this point on exam, will see what CT shows. Consider ENT or GI referral for EGD/laryngoscopy depending on symptoms at follow-up (heartburn return vs persistence of globus sensation). If worse, to ER   Abnormal finding on examination of thyroid gland - Plan: CBC, TSH, T4, free, CT SOFT TISSUE NECK W WO CONTRAST  Localized swelling, mass or lump of neck - Plan: CT SOFT TISSUE NECK W WO CONTRAST  RUQ pain - Plan: CBC, COMPLETE METABOLIC PANEL WITH GFR, Gamma GT, Lipase, US Abdomen Limited RUQ  Lipid screening - Plan: Lipid panel  Perimenopause - Plan: Lipid panel, VITAMIN D 25 Hydroxy (Vit-D Deficiency, Fractures), Estrogens, total, FSH/LH    Patient Instructions  Plan:  Need more information but I suspect gallbladder disease,  possible thyroid or esophagus abnormality, perimenopause all as relatively independent problems.   Labs today and will get you set up for some imaging of the neck and liver/gallbladder  Bring home blood pressure cuff to follow-up visit, we will also review results and recheck symptoms   In the meantime, avoid fatty foods and if worse (especially if unable to swallow or fever/headache or other concerning symptoms develop, seek emergent medical evaluation)     Visit summary with medication list and pertinent instructions was printed for patient to review. All questions at time of visit were answered - patient instructed to contact office with any additional concerns. ER/RTC precautions were reviewed with the patient. Follow-up plan: Return in about 1 week (around 11/23/2016) for revire results and recheck symptoms, to ER if worse or change.   Please note: voice recognition software was used to produce this document, and typos may escape review. Please contact Dr. Sheppard Coil for any needed clarifications.

## 2016-11-16 NOTE — Patient Instructions (Signed)
Plan:  Need more information but I suspect gallbladder disease, possible thyroid or esophagus abnormality, perimenopause all as relatively independent problems.   Labs today and will get you set up for some imaging of the neck and liver/gallbladder  Bring home blood pressure cuff to follow-up visit, we will also review results and recheck symptoms   In the meantime, avoid fatty foods and if worse (especially if unable to swallow or fever/headache or other concerning symptoms develop, seek emergent medical evaluation)

## 2016-11-19 ENCOUNTER — Encounter: Payer: Self-pay | Admitting: Osteopathic Medicine

## 2016-11-20 ENCOUNTER — Ambulatory Visit (INDEPENDENT_AMBULATORY_CARE_PROVIDER_SITE_OTHER): Payer: BLUE CROSS/BLUE SHIELD

## 2016-11-20 DIAGNOSIS — K8020 Calculus of gallbladder without cholecystitis without obstruction: Secondary | ICD-10-CM | POA: Diagnosis not present

## 2016-11-20 DIAGNOSIS — R946 Abnormal results of thyroid function studies: Secondary | ICD-10-CM | POA: Diagnosis not present

## 2016-11-20 DIAGNOSIS — R221 Localized swelling, mass and lump, neck: Secondary | ICD-10-CM

## 2016-11-20 DIAGNOSIS — K8080 Other cholelithiasis without obstruction: Secondary | ICD-10-CM | POA: Diagnosis not present

## 2016-11-20 LAB — FSH/LH
FSH: 64.1 m[IU]/mL
LH: 39.6 m[IU]/mL

## 2016-11-20 LAB — COMPLETE METABOLIC PANEL WITH GFR
AG Ratio: 2 (calc) (ref 1.0–2.5)
ALKALINE PHOSPHATASE (APISO): 81 U/L (ref 33–115)
ALT: 15 U/L (ref 6–29)
AST: 15 U/L (ref 10–35)
Albumin: 4.8 g/dL (ref 3.6–5.1)
BILIRUBIN TOTAL: 0.5 mg/dL (ref 0.2–1.2)
BUN: 10 mg/dL (ref 7–25)
CHLORIDE: 105 mmol/L (ref 98–110)
CO2: 24 mmol/L (ref 20–32)
Calcium: 9.5 mg/dL (ref 8.6–10.2)
Creat: 0.76 mg/dL (ref 0.50–1.10)
GFR, Est African American: 107 mL/min/{1.73_m2} (ref >=60)
GFR, Est Non African American: 92 mL/min/{1.73_m2} (ref >=60)
GLUCOSE: 94 mg/dL (ref 65–99)
Globulin: 2.4 g/dL (calc) (ref 1.9–3.7)
Potassium: 4 mmol/L (ref 3.5–5.3)
Sodium: 140 mmol/L (ref 135–146)
Total Protein: 7.2 g/dL (ref 6.1–8.1)

## 2016-11-20 LAB — LIPID PANEL
CHOL/HDL RATIO: 7.1 (calc) — AB (ref <5.0)
Cholesterol: 226 mg/dL — ABNORMAL HIGH (ref <200)
HDL: 32 mg/dL — AB (ref >50)
LDL CHOLESTEROL (CALC): 149 mg/dL — AB
NON-HDL CHOLESTEROL (CALC): 194 mg/dL — AB (ref <130)
TRIGLYCERIDES: 274 mg/dL — AB (ref <150)

## 2016-11-20 LAB — CBC
HCT: 39.5 % (ref 35.0–45.0)
Hemoglobin: 13.6 g/dL (ref 11.7–15.5)
MCH: 28.4 pg (ref 27.0–33.0)
MCHC: 34.4 g/dL (ref 32.0–36.0)
MCV: 82.5 fL (ref 80.0–100.0)
MPV: 9.7 fL (ref 7.5–12.5)
PLATELETS: 344 10*3/uL (ref 140–400)
RBC: 4.79 10*6/uL (ref 3.80–5.10)
RDW: 13.3 % (ref 11.0–15.0)
WBC: 6.7 10*3/uL (ref 3.8–10.8)

## 2016-11-20 LAB — VITAMIN D 25 HYDROXY (VIT D DEFICIENCY, FRACTURES): Vit D, 25-Hydroxy: 21 ng/mL — ABNORMAL LOW (ref 30–100)

## 2016-11-20 LAB — TSH: TSH: 1.33 mIU/L

## 2016-11-20 LAB — T4, FREE: Free T4: 1.3 ng/dL (ref 0.8–1.8)

## 2016-11-20 LAB — GAMMA GT: GGT: 24 U/L (ref 3–55)

## 2016-11-20 LAB — LIPASE: Lipase: 15 U/L (ref 7–60)

## 2016-11-20 LAB — ESTROGENS, TOTAL: Estrogen: 126.7 pg/mL

## 2016-11-20 MED ORDER — IOPAMIDOL (ISOVUE-300) INJECTION 61%
100.0000 mL | Freq: Once | INTRAVENOUS | Status: AC | PRN
  Administered 2016-11-20: 100 mL via INTRAVENOUS

## 2016-11-21 ENCOUNTER — Encounter: Payer: Self-pay | Admitting: Osteopathic Medicine

## 2016-11-21 ENCOUNTER — Ambulatory Visit: Payer: BLUE CROSS/BLUE SHIELD | Admitting: Osteopathic Medicine

## 2016-11-21 VITALS — Wt 194.0 lb

## 2016-11-21 DIAGNOSIS — R198 Other specified symptoms and signs involving the digestive system and abdomen: Secondary | ICD-10-CM | POA: Insufficient documentation

## 2016-11-21 DIAGNOSIS — R1011 Right upper quadrant pain: Secondary | ICD-10-CM | POA: Diagnosis not present

## 2016-11-21 DIAGNOSIS — K8020 Calculus of gallbladder without cholecystitis without obstruction: Secondary | ICD-10-CM | POA: Insufficient documentation

## 2016-11-21 DIAGNOSIS — F458 Other somatoform disorders: Secondary | ICD-10-CM | POA: Diagnosis not present

## 2016-11-21 DIAGNOSIS — R0989 Other specified symptoms and signs involving the circulatory and respiratory systems: Secondary | ICD-10-CM | POA: Insufficient documentation

## 2016-11-21 NOTE — Progress Notes (Signed)
HPI: Bonnie Peck is a 51 y.o. female who  has a past medical history of Hypertension.  she presents to [facility] today, 11/21/16,  for chief complaint of:  Chief Complaint  Patient presents with  . discuss imaging results    Initially seen 11/16/2016, 5 days ago. At that time, Pt reports feeling of neck fullness anteriorly in area of thyroid, globus/choking sensation and bad heartburn starting 5 days prior to her visit. Heartburn had gotten better with naturopathic treatment. Globus sensation remained, though no choking solid/liquids. No hx thyroid disease. No fever/chills, no headache, no numbness in arms or weakness, no pain at back of neck. On physical exam, there was some tenderness in the thyroid, no palpable nodules. CT neck results as below did not show any significant pathology other than small calcification in the thyroid. She states that this globus sensation has essentially resolved at this point.  Also at initial visit, reported RUQ pain on and off, worse recently, w/ loose stools. Eats fatty foods frequently, No hx GB disease known. Usually she has constipation. Abdominal ultrasound as below also does not show any significant pathology other than some stones, possible chronic inflammation of the gallbladder, fatty liver. She states that the abdominal pain still bothers her a little bit, she has been working on cutting out fat in the diet  Perimenopause, would like to know hormone levels. These were drawn at last visit. FSH and LH are in menopausal range.  As a general rule, she is reluctant to take prescription medications.    Patient is accompanied by husband who assists with history-taking.   Past medical, surgical, social and family history reviewed:  Patient Active Problem List   Diagnosis Date Noted  . Prepatellar bursitis of right knee 08/16/2015  . Hematuria 04/19/2015  . Essential hypertension 03/11/2015  . 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 APPENDECTOMY  age 41  . CESAREAN SECTION  1998  . KIDNEY SURGERY Left born   . RHINOPLASTY    . UTERINE SEPTUM RESECTION      Social History   Tobacco Use  . Smoking status: Never Smoker  . Smokeless tobacco: Never Used  Substance Use Topics  . Alcohol use: Yes    Family History  Problem Relation Age of Onset  . Melanoma Maternal Grandmother   . Heart attack Maternal Grandmother   . Heart attack Mother   . Diabetes Mother   . Hypertension Mother   . Hyperlipidemia Father   . Colon cancer Father      Current medication list and allergy/intolerance information reviewed:    Current Outpatient Medications  Medication Sig Dispense Refill  . CRANBERRY PO Take by mouth.    . cyanocobalamin 100 MCG tablet Take 100 mcg daily by mouth.    . diclofenac sodium (VOLTAREN) 1 % GEL Apply 4 g topically 4 (four) times daily. To affected joint. (Patient not taking: Reported on 11/16/2016) 100 g 11  . L-ARGININE PO Take by mouth.    . minocycline (MINOCIN,DYNACIN) 100 MG capsule Take 100 mg by mouth 2 (two) times daily.    . Probiotic Product (PRO-FLORA IMMUNE) CAPS Take by mouth.    . Rutin (RUTIN-50) 50 MG TABS Take daily by mouth.     No current facility-administered medications for this visit.     Allergies  Allergen Reactions  . Vancomycin Hives  . Ciprofloxacin   . Corticosteroids Hives  .  Erythromycin   . Penicillins Hives    Can take Keflex without problems  . Sulfa Antibiotics Hives      Review of Systems:  Constitutional:  No  fever, no chills, No recent illness  Cardiac: No  chest pain, No  pressure, No palpitations  Respiratory:  No  shortness of breath  Gastrointestinal: +abdominal pain, No  nausea, No  vomiting,  No  blood in stool, +diarrhea, +constipation earlier   Musculoskeletal: No new myalgia/arthralgia   Exam:  Wt 194 lb (88 kg)   BMI 33.30 kg/m  declines blood  pressure measurement  Constitutional: VS see above. General Appearance: alert, well-developed, well-nourished, NAD  Neck: No masses, trachea midline. No thyroid enlargement/mass appreciated.   Respiratory: Normal respiratory effort.   Musculoskeletal: Gait normal.   Neurological: Normal balance/coordination. No tremor.   Psychiatric: Normal judgment/insight. Normal mood and affect. Oriented x3.    Ct Soft Tissue Neck W Contrast  Result Date: 11/20/2016 CLINICAL DATA:  Localized swelling mass or lump in neck. Abnormal thyroid ultrasound EXAM: CT NECK WITH CONTRAST TECHNIQUE: Multidetector CT imaging of the neck was performed using the standard protocol following the bolus administration of intravenous contrast. CONTRAST:  34mL ISOVUE-300 IOPAMIDOL (ISOVUE-300) INJECTION 61% COMPARISON:  No prior ultrasound of the thyroid available for review FINDINGS: Pharynx and larynx: Normal. No mass or swelling. Salivary glands: No inflammation, mass, or stone. Thyroid: Thyroid normal in size and symmetric bilaterally. 3 mm calcification left posterior thyroid. No thyroid mass lesion identified. Lymph nodes: No enlarged or pathologic lymph nodes in the neck. Vascular: Negative Limited intracranial: Negative Visualized orbits: Negative Mastoids and visualized paranasal sinuses: Negative Skeleton: Negative Upper chest: Negative Other: None IMPRESSION: Negative for mass or adenopathy in the neck. Thyroid normal in size with a benign appearing punctate calcification in the left lobe of the thyroid. Correlate with prior ultrasound findings. Electronically Signed   By: Franchot Gallo M.D.   On: 11/20/2016 10:48   US Abdomen Limited Ruq  Result Date: 11/20/2016 CLINICAL DATA:  Intermittent right upper quadrant pain for the past 6 months. EXAM: ULTRASOUND ABDOMEN LIMITED RIGHT UPPER QUADRANT COMPARISON:  Abdominal CT scan of March 29, 2015 FINDINGS: Gallbladder: The gallbladder is adequately distended. There is a  1.8 cm echogenic mobile shadowing stone. There is no positive sonographic Murphy's sign. The gallbladder wall exhibits top-normal thickness at 3 mm. There is no pericholecystic fluid. Common bile duct: Diameter: 2.3 mm Liver: The hepatic echotexture is increased diffusely. The surface contour is smooth. There is no focal mass nor ductal dilation. Portal vein is patent on color Doppler imaging with normal direction of blood flow towards the liver. IMPRESSION: Gallstones without sonographic evidence of acute cholecystitis. Top-normal gallbladder wall thickness may reflect chronic inflammation. Increased hepatic echotexture most compatible with fatty infiltrative change. Electronically Signed   By: David  Martinique M.D.   On: 11/20/2016 13:14    Recent Results (from the past 2160 hour(s))  CBC     Status: None   Collection Time: 11/16/16 11:20 AM  Result Value Ref Range   WBC 6.7 3.8 - 10.8 Thousand/uL   RBC 4.79 3.80 - 5.10 Million/uL   Hemoglobin 13.6 11.7 - 15.5 g/dL   HCT 39.5 35.0 - 45.0 %   MCV 82.5 80.0 - 100.0 fL   MCH 28.4 27.0 - 33.0 pg   MCHC 34.4 32.0 - 36.0 g/dL   RDW 13.3 11.0 - 15.0 %   Platelets 344 140 - 400 Thousand/uL   MPV 9.7 7.5 -  12.5 fL  COMPLETE METABOLIC PANEL WITH GFR     Status: None   Collection Time: 11/16/16 11:20 AM  Result Value Ref Range   Glucose, Bld 94 65 - 99 mg/dL    Comment: .            Fasting reference interval .    BUN 10 7 - 25 mg/dL   Creat 0.76 0.50 - 1.10 mg/dL   GFR, Est Non African American 92 > OR = 60 mL/min/1.82m2   GFR, Est African American 107 > OR = 60 mL/min/1.39m2   BUN/Creatinine Ratio NOT APPLICABLE 6 - 22 (calc)   Sodium 140 135 - 146 mmol/L   Potassium 4.0 3.5 - 5.3 mmol/L   Chloride 105 98 - 110 mmol/L   CO2 24 20 - 32 mmol/L   Calcium 9.5 8.6 - 10.2 mg/dL   Total Protein 7.2 6.1 - 8.1 g/dL   Albumin 4.8 3.6 - 5.1 g/dL   Globulin 2.4 1.9 - 3.7 g/dL (calc)   AG Ratio 2.0 1.0 - 2.5 (calc)   Total Bilirubin 0.5 0.2 - 1.2  mg/dL   Alkaline phosphatase (APISO) 81 33 - 115 U/L   AST 15 10 - 35 U/L   ALT 15 6 - 29 U/L  TSH     Status: None   Collection Time: 11/16/16 11:20 AM  Result Value Ref Range   TSH 1.33 mIU/L    Comment:           Reference Range .           > or = 20 Years  0.40-4.50 .                Pregnancy Ranges           First trimester    0.26-2.66           Second trimester   0.55-2.73           Third trimester    0.43-2.91   Lipid panel     Status: Abnormal   Collection Time: 11/16/16 11:20 AM  Result Value Ref Range   Cholesterol 226 (H) <200 mg/dL   HDL 32 (L) >50 mg/dL   Triglycerides 274 (H) <150 mg/dL   LDL Cholesterol (Calc) 149 (H) mg/dL (calc)    Comment: Reference range: <100 . Desirable range <100 mg/dL for primary prevention;   <70 mg/dL for patients with CHD or diabetic patients  with > or = 2 CHD risk factors. Marland Kitchen LDL-C is now calculated using the Martin-Hopkins  calculation, which is a validated novel method providing  better accuracy than the Friedewald equation in the  estimation of LDL-C.  Cresenciano Genre et al. Annamaria Helling. 7628;315(17): 2061-2068  (http://education.[facility].com/faq/FAQ164)    Total CHOL/HDL Ratio 7.1 (H) <5.0 (calc)   Non-HDL Cholesterol (Calc) 194 (H) <130 mg/dL (calc)    Comment: For patients with diabetes plus 1 major ASCVD risk  factor, treating to a non-HDL-C goal of <100 mg/dL  (LDL-C of <70 mg/dL) is considered a therapeutic  option.   T4, free     Status: None   Collection Time: 11/16/16 11:20 AM  Result Value Ref Range   Free T4 1.3 0.8 - 1.8 ng/dL  VITAMIN D 25 Hydroxy (Vit-D Deficiency, Fractures)     Status: Abnormal   Collection Time: 11/16/16 11:20 AM  Result Value Ref Range   Vit D, 25-Hydroxy 21 (L) 30 - 100 ng/mL    Comment: Vitamin D Status  25-OH Vitamin D: . Deficiency:                    <20 ng/mL Insufficiency:             20 - 29 ng/mL Optimal:                 > or = 30 ng/mL . For 25-OH Vitamin D testing  on patients on  D2-supplementation and patients for whom quantitation  of D2 and D3 fractions is required, the [employer]ure[facility](TM) 25-OH VIT D, (D2,D3), LC/MS/MS is recommended: order  code [phone] (patients >61yrs). . For more information on this test, go to: http://education.[facility].com/faq/FAQ163 (This link is being provided for  informational/educational purposes only.)   Estrogens, total     Status: None   Collection Time: 11/16/16 11:20 AM  Result Value Ref Range   Estrogen 126.7 pg/mL    Comment: . Reference Ranges for Total Estrogen: .   Follicular Phase        (1-12 days):  90-590 pg/mL   Luteal Phase:     130-460 pg/mL   Postmenopausal:    50-170 pg/mL . The total estrogen assay is not recommended for use in pre-pubertal children.   FSH/LH     Status: None   Collection Time: 11/16/16 11:20 AM  Result Value Ref Range   FSH 64.1 mIU/mL    Comment:                     Reference Range .              Follicular Phase       3.0-16.0              Mid-cycle Peak         3.1-17.7              Luteal Phase           1.5- 9.1              Postmenopausal       23.0-116.3              .    LH 39.6 mIU/mL    Comment:     Reference Range Follicular Phase  1.0-93.2 Mid-Cycle Peak    8.7-76.3 Luteal Phase      0.5-16.9 Postmenopausal    10.0-54.7   Gamma GT     Status: None   Collection Time: 11/16/16 11:20 AM  Result Value Ref Range   GGT 24 3 - 55 U/L  Lipase     Status: None   Collection Time: 11/16/16 11:20 AM  Result Value Ref Range   Lipase 15 7 - 60 U/L     ASSESSMENT/PLAN: Images and results reviewed in detail with the patient. Nothing to really explain her symptoms, and her symptoms are overall resolving anyway. Would like to continue with decreasing fat in diet and see if this helps right upper quadrant discomfort, if not, we'll send to general surgeons to discuss elective removal of gallbladder  Globus sensation - Resolved after improvement of GERD,  incidental finding of thyroid calcification on CT and labs normal. Monitor, RTC if recurs/worse/change  RUQ pain  Calculus of gallbladder without cholecystitis without obstruction - Consider elective removal    Patient Instructions  Plan:  Reduce fatty foods  If GB acting up, will refer to surgeon to discuss removal if needed     Visit summary with medication list and  pertinent instructions was printed for patient to review. All questions at time of visit were answered - patient instructed to contact office with any additional concerns. ER/RTC precautions were reviewed with the patient. Follow-up plan: Return for when due for annual physical .  Total time spent 25 minutes, greater than 50% of the visit spent face-to-face counseling and correlating care for  The primary encounter diagnosis was Globus sensation. Diagnoses of RUQ pain and Calculus of gallbladder without cholecystitis without obstruction were also pertinent to this visit.   Please note: voice recognition software was used to produce this document, and typos may escape review. Please contact Dr. Sheppard Coil for any needed clarifications.

## 2016-11-21 NOTE — Patient Instructions (Signed)
Plan:  Reduce fatty foods  If GB acting up, will refer to surgeon to discuss removal if needed

## 2016-12-05 DIAGNOSIS — M7661 Achilles tendinitis, right leg: Secondary | ICD-10-CM | POA: Diagnosis not present

## 2016-12-05 DIAGNOSIS — M79671 Pain in right foot: Secondary | ICD-10-CM | POA: Diagnosis not present

## 2016-12-05 DIAGNOSIS — G8929 Other chronic pain: Secondary | ICD-10-CM | POA: Diagnosis not present

## 2016-12-20 DIAGNOSIS — M7661 Achilles tendinitis, right leg: Secondary | ICD-10-CM | POA: Diagnosis not present

## 2016-12-27 DIAGNOSIS — M7661 Achilles tendinitis, right leg: Secondary | ICD-10-CM | POA: Diagnosis not present

## 2017-01-02 DIAGNOSIS — M7661 Achilles tendinitis, right leg: Secondary | ICD-10-CM | POA: Diagnosis not present

## 2017-01-04 DIAGNOSIS — M7661 Achilles tendinitis, right leg: Secondary | ICD-10-CM | POA: Diagnosis not present

## 2017-01-07 DIAGNOSIS — M79641 Pain in right hand: Secondary | ICD-10-CM | POA: Diagnosis not present

## 2017-01-17 DIAGNOSIS — M25571 Pain in right ankle and joints of right foot: Secondary | ICD-10-CM | POA: Diagnosis not present

## 2017-01-21 DIAGNOSIS — M6701 Short Achilles tendon (acquired), right ankle: Secondary | ICD-10-CM | POA: Diagnosis not present

## 2017-01-21 DIAGNOSIS — M7661 Achilles tendinitis, right leg: Secondary | ICD-10-CM | POA: Diagnosis not present

## 2017-01-21 DIAGNOSIS — M898X7 Other specified disorders of bone, ankle and foot: Secondary | ICD-10-CM | POA: Diagnosis not present

## 2017-02-05 DIAGNOSIS — T8484XA Pain due to internal orthopedic prosthetic devices, implants and grafts, initial encounter: Secondary | ICD-10-CM | POA: Diagnosis not present

## 2017-02-05 DIAGNOSIS — G8918 Other acute postprocedural pain: Secondary | ICD-10-CM | POA: Diagnosis not present

## 2017-02-05 DIAGNOSIS — M9261 Juvenile osteochondrosis of tarsus, right ankle: Secondary | ICD-10-CM | POA: Diagnosis not present

## 2017-02-05 DIAGNOSIS — M6701 Short Achilles tendon (acquired), right ankle: Secondary | ICD-10-CM | POA: Diagnosis not present

## 2017-02-05 DIAGNOSIS — M7661 Achilles tendinitis, right leg: Secondary | ICD-10-CM | POA: Diagnosis not present

## 2017-02-05 DIAGNOSIS — M7731 Calcaneal spur, right foot: Secondary | ICD-10-CM | POA: Diagnosis not present

## 2017-02-05 DIAGNOSIS — M66871 Spontaneous rupture of other tendons, right ankle and foot: Secondary | ICD-10-CM | POA: Diagnosis not present

## 2017-02-11 DIAGNOSIS — M7661 Achilles tendinitis, right leg: Secondary | ICD-10-CM | POA: Diagnosis not present

## 2017-02-12 DIAGNOSIS — M7661 Achilles tendinitis, right leg: Secondary | ICD-10-CM | POA: Diagnosis not present

## 2017-02-18 DIAGNOSIS — M7661 Achilles tendinitis, right leg: Secondary | ICD-10-CM | POA: Diagnosis not present

## 2017-03-16 DIAGNOSIS — Z1231 Encounter for screening mammogram for malignant neoplasm of breast: Secondary | ICD-10-CM | POA: Diagnosis not present

## 2017-03-26 DIAGNOSIS — Z780 Asymptomatic menopausal state: Secondary | ICD-10-CM | POA: Diagnosis not present

## 2017-03-26 DIAGNOSIS — R928 Other abnormal and inconclusive findings on diagnostic imaging of breast: Secondary | ICD-10-CM | POA: Diagnosis not present

## 2017-03-26 DIAGNOSIS — Z9889 Other specified postprocedural states: Secondary | ICD-10-CM | POA: Diagnosis not present

## 2017-03-26 DIAGNOSIS — Z01419 Encounter for gynecological examination (general) (routine) without abnormal findings: Secondary | ICD-10-CM | POA: Diagnosis not present

## 2017-03-26 DIAGNOSIS — Z01411 Encounter for gynecological examination (general) (routine) with abnormal findings: Secondary | ICD-10-CM | POA: Diagnosis not present

## 2017-03-26 DIAGNOSIS — N888 Other specified noninflammatory disorders of cervix uteri: Secondary | ICD-10-CM | POA: Diagnosis not present

## 2017-03-26 DIAGNOSIS — Z1151 Encounter for screening for human papillomavirus (HPV): Secondary | ICD-10-CM | POA: Diagnosis not present

## 2017-03-26 DIAGNOSIS — Z8742 Personal history of other diseases of the female genital tract: Secondary | ICD-10-CM | POA: Diagnosis not present

## 2017-03-26 DIAGNOSIS — Q600 Renal agenesis, unilateral: Secondary | ICD-10-CM | POA: Diagnosis not present

## 2017-03-26 DIAGNOSIS — I10 Essential (primary) hypertension: Secondary | ICD-10-CM | POA: Diagnosis not present

## 2017-04-01 DIAGNOSIS — M7661 Achilles tendinitis, right leg: Secondary | ICD-10-CM | POA: Diagnosis not present

## 2017-04-02 DIAGNOSIS — R928 Other abnormal and inconclusive findings on diagnostic imaging of breast: Secondary | ICD-10-CM | POA: Diagnosis not present

## 2017-04-02 DIAGNOSIS — N6489 Other specified disorders of breast: Secondary | ICD-10-CM | POA: Diagnosis not present

## 2017-04-04 DIAGNOSIS — M7661 Achilles tendinitis, right leg: Secondary | ICD-10-CM | POA: Diagnosis not present

## 2017-04-09 DIAGNOSIS — M7661 Achilles tendinitis, right leg: Secondary | ICD-10-CM | POA: Diagnosis not present

## 2017-04-11 DIAGNOSIS — M7661 Achilles tendinitis, right leg: Secondary | ICD-10-CM | POA: Diagnosis not present

## 2017-04-15 DIAGNOSIS — M7661 Achilles tendinitis, right leg: Secondary | ICD-10-CM | POA: Diagnosis not present

## 2017-04-18 DIAGNOSIS — M7661 Achilles tendinitis, right leg: Secondary | ICD-10-CM | POA: Diagnosis not present

## 2017-04-22 DIAGNOSIS — M7661 Achilles tendinitis, right leg: Secondary | ICD-10-CM | POA: Diagnosis not present

## 2017-04-25 DIAGNOSIS — M7661 Achilles tendinitis, right leg: Secondary | ICD-10-CM | POA: Diagnosis not present

## 2017-05-29 DIAGNOSIS — M7661 Achilles tendinitis, right leg: Secondary | ICD-10-CM | POA: Diagnosis not present

## 2017-06-10 ENCOUNTER — Encounter: Payer: Self-pay | Admitting: Osteopathic Medicine

## 2017-06-10 ENCOUNTER — Ambulatory Visit (INDEPENDENT_AMBULATORY_CARE_PROVIDER_SITE_OTHER): Payer: BLUE CROSS/BLUE SHIELD | Admitting: Osteopathic Medicine

## 2017-06-10 ENCOUNTER — Ambulatory Visit (INDEPENDENT_AMBULATORY_CARE_PROVIDER_SITE_OTHER): Payer: BLUE CROSS/BLUE SHIELD

## 2017-06-10 VITALS — BP 173/124 | HR 84 | Temp 98.7°F | Wt 190.9 lb

## 2017-06-10 DIAGNOSIS — R42 Dizziness and giddiness: Secondary | ICD-10-CM

## 2017-06-10 DIAGNOSIS — Q600 Renal agenesis, unilateral: Secondary | ICD-10-CM | POA: Diagnosis not present

## 2017-06-10 DIAGNOSIS — R8290 Unspecified abnormal findings in urine: Secondary | ICD-10-CM

## 2017-06-10 DIAGNOSIS — Z8781 Personal history of (healed) traumatic fracture: Secondary | ICD-10-CM

## 2017-06-10 DIAGNOSIS — Z87311 Personal history of (healed) other pathological fracture: Secondary | ICD-10-CM | POA: Diagnosis not present

## 2017-06-10 DIAGNOSIS — Z905 Acquired absence of kidney: Secondary | ICD-10-CM

## 2017-06-10 DIAGNOSIS — M5412 Radiculopathy, cervical region: Secondary | ICD-10-CM | POA: Diagnosis not present

## 2017-06-10 DIAGNOSIS — M542 Cervicalgia: Secondary | ICD-10-CM

## 2017-06-10 DIAGNOSIS — R0789 Other chest pain: Secondary | ICD-10-CM | POA: Diagnosis not present

## 2017-06-10 DIAGNOSIS — R55 Syncope and collapse: Secondary | ICD-10-CM

## 2017-06-10 DIAGNOSIS — R82998 Other abnormal findings in urine: Secondary | ICD-10-CM

## 2017-06-10 DIAGNOSIS — I700 Atherosclerosis of aorta: Secondary | ICD-10-CM | POA: Diagnosis not present

## 2017-06-10 DIAGNOSIS — M5031 Other cervical disc degeneration,  high cervical region: Secondary | ICD-10-CM

## 2017-06-10 DIAGNOSIS — R0782 Intercostal pain: Secondary | ICD-10-CM | POA: Diagnosis not present

## 2017-06-10 DIAGNOSIS — G44219 Episodic tension-type headache, not intractable: Secondary | ICD-10-CM | POA: Diagnosis not present

## 2017-06-10 DIAGNOSIS — I10 Essential (primary) hypertension: Secondary | ICD-10-CM

## 2017-06-10 DIAGNOSIS — R51 Headache: Secondary | ICD-10-CM

## 2017-06-10 DIAGNOSIS — M50322 Other cervical disc degeneration at C5-C6 level: Secondary | ICD-10-CM | POA: Diagnosis not present

## 2017-06-10 DIAGNOSIS — I2510 Atherosclerotic heart disease of native coronary artery without angina pectoris: Secondary | ICD-10-CM

## 2017-06-10 LAB — COMPLETE METABOLIC PANEL WITH GFR
AG Ratio: 2.2 (calc) (ref 1.0–2.5)
ALT: 16 U/L (ref 6–29)
AST: 14 U/L (ref 10–35)
Albumin: 4.8 g/dL (ref 3.6–5.1)
Alkaline phosphatase (APISO): 75 U/L (ref 33–130)
BUN: 10 mg/dL (ref 7–25)
CO2: 24 mmol/L (ref 20–32)
CREATININE: 0.72 mg/dL (ref 0.50–1.05)
Calcium: 9.4 mg/dL (ref 8.6–10.4)
Chloride: 106 mmol/L (ref 98–110)
GFR, Est African American: 113 mL/min/{1.73_m2} (ref >=60)
GFR, Est Non African American: 98 mL/min/{1.73_m2} (ref >=60)
Globulin: 2.2 g/dL (calc) (ref 1.9–3.7)
Glucose, Bld: 109 mg/dL — ABNORMAL HIGH (ref 65–99)
Potassium: 4.2 mmol/L (ref 3.5–5.3)
Sodium: 140 mmol/L (ref 135–146)
Total Bilirubin: 0.5 mg/dL (ref 0.2–1.2)
Total Protein: 7 g/dL (ref 6.1–8.1)

## 2017-06-10 LAB — TROPONIN I

## 2017-06-10 MED ORDER — IOPAMIDOL (ISOVUE-370) INJECTION 76%
100.0000 mL | Freq: Once | INTRAVENOUS | Status: AC | PRN
Start: 2017-06-10 — End: 2017-06-10
  Administered 2017-06-10: 100 mL via INTRAVENOUS

## 2017-06-10 NOTE — Progress Notes (Signed)
Pt has seen results on MyChart and message also sent for patient to call back if any questions.

## 2017-06-10 NOTE — Progress Notes (Signed)
HPI: Bonnie Peck is a 52 y.o. female who  has a past medical history of Hypertension.  she presents to [facility] today, 06/10/17,  for chief complaint of:  Dizziness, headache   . Reports stiffness in the posterior neck and upper back into her shoulders, headache, dizzy spells which have gotten worse to the point her shoulders and middle of her neck/upper back reports pain worse as the day goes on. Uses "natural" medicines to help with pain, which do resolve it for a time but then it comes back worse.   . She states that the dizziness is not really anything spinning/vertiginous, no hearing change, reports feeling of sweatiness or lightheadedness, no orthostatic symptoms.   . Headache feels like Perrott pain, it is on the top of her head, no thunderclap.  Reports some vision issues with near vision on and off, last eye exam was about 2 years ago  Reports occasional chest pain past few days, twinges in L of sternum and this feels like it shoots through to her back. No ripping pain. No SOB. No LOC.   . 211/126 BP in OBGYN visit 03/26/17. Today on intake elevated BP and this has been the case previous, she reports "white coat syndrome," she declined BP measurement 11/21/17 last visit here.  She has been reluctant to accept a diagnosis of hypertension or to take medications for this.  Apparently a few medicines tried in the past resulted in some adverse effects. . History of fracture of C7 several years ago, thinks this may be contributing to her pain.  She states that on occasion she will feel some paresthesias though no weakness, worse on the right side arm after sleeping on this side, typically resolves after walking around for a bit.  No times currently.  Patient is accompanied by husband who assists with history-taking.    Past medical history, surgical history, and family history reviewed.  Current medication list and allergy/intolerance information reviewed.    (See remainder of HPI, ROS, Phys Exam below)  Ct Angio Chest/abd/pel For Dissection W And/or W/wo  Result Date: 06/10/2017 CLINICAL DATA:  Stiff neck, syncopal symptoms, headache and hypertension. EXAM: CT ANGIOGRAPHY CHEST, ABDOMEN AND PELVIS TECHNIQUE: Multidetector CT imaging through the chest, abdomen and pelvis was performed using the standard protocol during bolus administration of intravenous contrast. Multiplanar reconstructed images and MIPs were obtained and reviewed to evaluate the vascular anatomy. CONTRAST:  140mL ISOVUE-370 IOPAMIDOL (ISOVUE-370) INJECTION 76% COMPARISON:  CT of the abdomen on 03/29/2015 FINDINGS: CTA CHEST FINDINGS Cardiovascular: The thoracic aorta is normal in caliber and demonstrates no evidence of aneurysmal disease, dissection or significant atherosclerosis. Proximal great vessels are normally patent and show normal branching anatomy. Central pulmonary arteries are normal in caliber. The heart size is normal. There is a mild amount calcified plaque in the distribution of the LAD. No pericardial fluid identified. Mediastinum/Nodes: No enlarged mediastinal, hilar, or axillary lymph nodes. Thyroid gland, trachea, and esophagus demonstrate no significant findings. Lungs/Pleura: There is no evidence of pulmonary edema, consolidation, pneumothorax, nodule or pleural fluid. Musculoskeletal: No chest wall abnormality. No acute or significant osseous findings. Review of the MIP images confirms the above findings. CTA ABDOMEN AND PELVIS FINDINGS VASCULAR Aorta: The abdominal aorta shows normal caliber and patency without evidence of aneurysm or dissection. No significant atherosclerotic plaque identified. Celiac: Normally patent.  Normal distal branch vessel anatomy. SMA: Normally patent. Renals: Widely patent single right renal artery. IMA: Normally patent. Inflow: Bilateral iliac arteries  and common femoral arteries show normal patency. Review of the MIP images confirms the above  findings. NON-VASCULAR Hepatobiliary: Some degree of hepatic steatosis is suspected. No overt cirrhosis. No hepatic masses or biliary dilatation. The gallbladder appears unremarkable by CT. Pancreas: Unremarkable. No pancreatic ductal dilatation or surrounding inflammatory changes. Spleen: Normal in size without focal abnormality. Adrenals/Urinary Tract: There is a solitary right kidney. No evidence of renal mass, hydronephrosis or calculi. The bladder appears unremarkable. No adrenal masses. Stomach/Bowel: Bowel shows no evidence of obstruction or inflammation. No free air. Sparse diverticulosis of the lower descending and sigmoid colon. Lymphatic: No evidence of enlarged lymph nodes in the abdomen or pelvis. Reproductive: Uterus and bilateral adnexa are unremarkable. 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 Other: No abdominal wall hernia or abnormality. No abdominopelvic ascites. Musculoskeletal: No acute or significant osseous findings. Review of the MIP images confirms the above findings. IMPRESSION: 1. No evidence of acute aortic pathology in the chest, abdomen or pelvis. 2. Coronary atherosclerosis with mildly calcified plaque in the distribution of the LAD. 3. Probable hepatic steatosis. 4. Solitary right kidney. 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 Electronically Signed   By: Aletta Edouard M.D.   On: 06/10/2017 16:54    Results for orders placed or performed in visit on 06/10/17 (from the past 72 hour(s))  COMPLETE METABOLIC PANEL WITH GFR     Status: Abnormal   Collection Time: 06/10/17 11:45 AM  Result Value Ref Range   Glucose, Bld 109 (H) 65 - 99 mg/dL    Comment: .            Fasting reference interval . For someone without known diabetes, a glucose value between 100 and 125 mg/dL is consistent with prediabetes and should be confirmed with a follow-up test. .    BUN 10 7 - 25 mg/dL   Creat 0.72 0.50 - 1.05 mg/dL    Comment: For patients >48 years of age, the reference  limit for Creatinine is approximately 13% higher for people identified as African-American. .    GFR, Est Non African American 98 > OR = 60 mL/min/1.9m2   GFR, Est African American 113 > OR = 60 mL/min/1.15m2   BUN/Creatinine Ratio NOT APPLICABLE 6 - 22 (calc)   Sodium 140 135 - 146 mmol/L   Potassium 4.2 3.5 - 5.3 mmol/L   Chloride 106 98 - 110 mmol/L   CO2 24 20 - 32 mmol/L   Calcium 9.4 8.6 - 10.4 mg/dL   Total Protein 7.0 6.1 - 8.1 g/dL   Albumin 4.8 3.6 - 5.1 g/dL   Globulin 2.2 1.9 - 3.7 g/dL (calc)   AG Ratio 2.2 1.0 - 2.5 (calc)   Total Bilirubin 0.5 0.2 - 1.2 mg/dL   Alkaline phosphatase (APISO) 75 33 - 130 U/L   AST 14 10 - 35 U/L   ALT 16 6 - 29 U/L  Troponin I     Status: None   Collection Time: 06/10/17 11:53 AM  Result Value Ref Range   Troponin I <0.01 < OR = 0.0 ng/mL    Comment: . In accord with published recommendations, serial testing of troponin I at intervals of 2 to 4 hours for up to 12 to 24 hours is suggested in order to corroborate a single troponin I result. An elevated troponin alone is not sufficient to make the diagnosis of MI. . . 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 - Plan: EKG 12-Lead, COMPLETE METABOLIC PANEL WITH GFR  Uncontrolled hypertension - Reassuring that renal function is normal and troponins are negative - Plan: CBC with Differential/Platelet, Lipid panel, TSH, Magnesium, Phosphorus, Urinalysis, Routine w reflex microscopic, Troponin I, EKG 12-Lead, COMPLETE METABOLIC PANEL WITH GFR, CANCELED: COMPLETE METABOLIC PANEL WITH GFR  Episodic lightheadedness - Plan: Troponin I, COMPLETE METABOLIC PANEL WITH  GFR  Cervical radiculopathy  Kidney congenitally absent, left - Plan: Magnesium, Phosphorus, Urinalysis, Routine w reflex microscopic, COMPLETE METABOLIC PANEL WITH GFR  Chest discomfort - Given occasional pains of chest into back, dissection or other serious pathology does not seem likely but of course we should rule this out - Plan: Troponin I, EKG 12-Lead, COMPLETE METABOLIC PANEL WITH GFR  Neck pain - Plan: DG Cervical Spine 2 or 3 views, EKG 12-Lead  H/O cervical fracture - Plan: DG Cervical Spine 2 or 3 views  Intercostal pain - Plan: CT Angio Chest/Abd/Pel for Dissection W and/or W/WO   No orders of the defined types were placed in this encounter.   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   I understand you are reluctant to take "non-natural" medicine or accept a diagnosis of true hypertension, and I am happy to be proven wrong by a verified home blood pressure measurement - but your blood pressure numbers have been in an unsafe range for a long time. Based on available numbers, you are at risk of early death or other serious organ damage to heart, brain, and kidney. I am only worried about your safety!   I think an Xray of the neck and home exercises/PT would be helpful, if these aren't improving your symptoms we should consider an MRI. I don't think we need to jump to an MRI right now.   Your chest pain symptoms concern me, too. If you are ever having concerning chest pain, please go to a hospital ASAP! The EKG today is reassuring but a normal EKG now does not do a good job of predicting future heart attack risk. We got a lab level today called Troponin, we should have this result back later today. If it is high, it likely means you're having  a heart attack or have had one recently - please go to the nearest ER if we call you to alert you that this level is high!   We are also getting a CTA of the chest to evaluate for an (unlikely but possible) aortic dissection as cause of your chest pain    Follow-up plan: Return for recheck and review labs in 2-3 days.     ############################################ ############################################ ############################################ ############################################    Outpatient Encounter Medications as of 06/10/2017  Medication Sig  . Cholecalciferol (VITAMIN D3) 1000 units CAPS Take by mouth.  . cyanocobalamin 100 MCG tablet Take 100 mcg daily by mouth.  Marland Kitchen MILK THISTLE EXTRACT PO Take by mouth.  . thiamine (VITAMIN B-1) 100 MG tablet Take 100 mg by mouth daily.  Marland Kitchen CRANBERRY PO Take by mouth.  . L-ARGININE PO Take by mouth.  . minocycline (MINOCIN,DYNACIN) 100 MG capsule Take 100 mg by  mouth 2 (two) times daily.  . Probiotic Product (PRO-FLORA IMMUNE) CAPS Take by mouth.  . Rutin (RUTIN-50) 50 MG TABS Take 500 mg by mouth daily.   . [DISCONTINUED] diclofenac sodium (VOLTAREN) 1 % GEL Apply 4 g topically 4 (four) times daily. To affected joint. (Patient not taking: Reported on 11/16/2016)   No facility-administered encounter medications on file as of 06/10/2017.    Allergies  Allergen Reactions  . Vancomycin Hives  . Ceclor [Cefaclor]   . Cefdinir   . Ciprofloxacin   . Corticosteroids Hives  . Erythromycin     Per patient she is allergic to "all myocins"  . Penicillins Hives    Can take Keflex without problems  . Sulfa Antibiotics Hives      Review of Systems:  Constitutional: No recent illness  HEENT: +headache, +vision change -hairiness on and off, last vision exam was about 2 years ago  Cardiac: +chest pain, No  pressure, No palpitations  Respiratory:  No  shortness of breath. No  Cough  Gastrointestinal: No  abdominal pain,  no change on bowel habits  Musculoskeletal: No new myalgia/arthralgia  Skin: No  Rash  Hem/Onc: No  easy bruising/bleeding, No  abnormal lumps/bumps  Neurologic: No  weakness, +Dizziness  Psychiatric: No  concerns with depression, No  concerns with anxiety  Exam:  There were no vitals taken for this visit.  Constitutional: VS see above. General Appearance: alert, well-developed, well-nourished, NAD  Eyes: Normal lids and conjunctive, non-icteric sclera  Ears, Nose, Mouth, Throat: MMM, Normal external inspection ears/nares/mouth/lips/gums.  Neck: No masses, trachea midline.   Respiratory: Normal respiratory effort. no wheeze, no rhonchi, no rales  Cardiovascular: S1/S2 normal, no murmur, no rub/gallop auscultated. RRR.   Musculoskeletal: Gait normal. Symmetric and independent movement of all extremities  Neurological: Normal balance/coordination. No tremor.  Skin: warm, dry, intact.   Psychiatric: Normal judgment/insight. Normal mood and affect. Oriented x3.   Visit summary with medication list and pertinent instructions was printed for patient to review, advised to alert [place] if any changes needed. All questions at time of visit were answered - patient instructed to contact office with any additional concerns. ER/RTC precautions were reviewed with the patient and understanding verbalized.   Follow-up plan: Return for recheck and review labs in 2-3 days.  Note: Total time spent 40 minutes, greater than 50% of the visit was spent face-to-face counseling and coordinating care for the following: The primary encounter diagnosis was Episodic tension-type headache, not intractable. Diagnoses of Uncontrolled hypertension, Episodic lightheadedness, Cervical radiculopathy, Kidney congenitally absent, left, Chest discomfort, Neck pain, H/O cervical fracture, and Intercostal pain were also pertinent to this visit.Marland Kitchen  Please note: voice recognition software was used to produce this document,  and typos may escape review. Please contact Dr. Sheppard Coil for any needed clarifications.

## 2017-06-10 NOTE — Patient Instructions (Addendum)
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   I understand you are reluctant to take "non-natural" medicine or accept a diagnosis of true hypertension, and I am happy to be proven wrong by a verified home blood pressure measurement - but your blood pressure numbers have been in an unsafe range for a long time. Based on available numbers, you are at risk of early death or other serious organ damage to heart, brain, and kidney. I am only worried about your safety!   I think an Xray of the neck and home exercises/PT would be helpful, if these aren't improving your symptoms we should consider an MRI. I don't think we need to jump to an MRI right now.   Your chest pain symptoms concern me, too. If you are ever having concerning chest pain, please go to a hospital ASAP! The EKG today is reassuring but a normal EKG now does not do a good job of predicting future heart attack risk. We got a lab level today called Troponin, we should have this result back later today. If it is high, it likely means you're having a heart attack or have had one recently - please go to the nearest ER if we call you to alert you that this level is high!   We are also getting a CTA of the chest to evaluate for an (unlikely but possible) aortic dissection as cause of your chest pain

## 2017-06-11 ENCOUNTER — Encounter: Payer: Self-pay | Admitting: Osteopathic Medicine

## 2017-06-11 DIAGNOSIS — R82998 Other abnormal findings in urine: Secondary | ICD-10-CM | POA: Diagnosis not present

## 2017-06-11 DIAGNOSIS — R8290 Unspecified abnormal findings in urine: Secondary | ICD-10-CM | POA: Diagnosis not present

## 2017-06-11 LAB — URINALYSIS, ROUTINE W REFLEX MICROSCOPIC
BILIRUBIN URINE: NEGATIVE
Glucose, UA: NEGATIVE
KETONES UR: NEGATIVE
NITRITE: NEGATIVE
Protein, ur: NEGATIVE
RBC / HPF: NONE SEEN /HPF (ref 0–2)
SPECIFIC GRAVITY, URINE: 1.019 (ref 1.001–1.03)

## 2017-06-11 LAB — CBC WITH DIFFERENTIAL/PLATELET
Basophils Absolute: 29 cells/uL (ref 0–200)
Basophils Relative: 0.6 %
EOS ABS: 53 {cells}/uL (ref 15–500)
EOS PCT: 1.1 %
HEMATOCRIT: 40.9 % (ref 35.0–45.0)
HEMOGLOBIN: 14.2 g/dL (ref 11.7–15.5)
LYMPHS ABS: 1584 {cells}/uL (ref 850–3900)
MCH: 29.2 pg (ref 27.0–33.0)
MCHC: 34.7 g/dL (ref 32.0–36.0)
MCV: 84 fL (ref 80.0–100.0)
MONOS PCT: 6.1 %
MPV: 10.3 fL (ref 7.5–12.5)
NEUTROS ABS: 2842 {cells}/uL (ref 1500–7800)
NEUTROS PCT: 59.2 %
Platelets: 339 10*3/uL (ref 140–400)
RBC: 4.87 10*6/uL (ref 3.80–5.10)
RDW: 13.4 % (ref 11.0–15.0)
Total Lymphocyte: 33 %
WBC mixed population: 293 cells/uL (ref 200–950)
WBC: 4.8 10*3/uL (ref 3.8–10.8)

## 2017-06-11 LAB — PHOSPHORUS: PHOSPHORUS: 3.1 mg/dL (ref 2.5–4.5)

## 2017-06-11 LAB — LIPID PANEL
Cholesterol: 208 mg/dL — ABNORMAL HIGH (ref <200)
HDL: 38 mg/dL — ABNORMAL LOW (ref >50)
LDL Cholesterol (Calc): 139 mg/dL (calc) — ABNORMAL HIGH
NON-HDL CHOLESTEROL (CALC): 170 mg/dL — AB (ref <130)
TRIGLYCERIDES: 174 mg/dL — AB (ref <150)
Total CHOL/HDL Ratio: 5.5 (calc) — ABNORMAL HIGH (ref <5.0)

## 2017-06-11 LAB — TSH: TSH: 0.88 m[IU]/L

## 2017-06-11 LAB — MAGNESIUM: Magnesium: 2.1 mg/dL (ref 1.5–2.5)

## 2017-06-11 NOTE — Addendum Note (Signed)
Addended by: Maryla Morrow on: 06/11/2017 10:26 AM   Modules accepted: Orders

## 2017-06-13 ENCOUNTER — Encounter: Payer: Self-pay | Admitting: Osteopathic Medicine

## 2017-06-13 ENCOUNTER — Ambulatory Visit: Payer: BLUE CROSS/BLUE SHIELD | Admitting: Osteopathic Medicine

## 2017-06-13 LAB — URINE CULTURE
MICRO NUMBER:: 90704525
SPECIMEN QUALITY:: ADEQUATE

## 2017-06-17 DIAGNOSIS — M5030 Other cervical disc degeneration, unspecified cervical region: Secondary | ICD-10-CM | POA: Diagnosis not present

## 2017-06-21 DIAGNOSIS — M5030 Other cervical disc degeneration, unspecified cervical region: Secondary | ICD-10-CM | POA: Diagnosis not present

## 2017-06-22 DIAGNOSIS — M5030 Other cervical disc degeneration, unspecified cervical region: Secondary | ICD-10-CM | POA: Diagnosis not present

## 2017-06-26 ENCOUNTER — Other Ambulatory Visit (HOSPITAL_COMMUNITY): Payer: Self-pay

## 2017-06-26 ENCOUNTER — Other Ambulatory Visit: Payer: Self-pay | Admitting: Osteopathic Medicine

## 2017-06-26 ENCOUNTER — Other Ambulatory Visit (HOSPITAL_COMMUNITY): Payer: Self-pay | Admitting: Chiropractic Medicine

## 2017-06-26 DIAGNOSIS — M5412 Radiculopathy, cervical region: Secondary | ICD-10-CM | POA: Diagnosis not present

## 2017-06-26 DIAGNOSIS — R9389 Abnormal findings on diagnostic imaging of other specified body structures: Secondary | ICD-10-CM

## 2017-06-28 ENCOUNTER — Ambulatory Visit (HOSPITAL_COMMUNITY)
Admission: RE | Admit: 2017-06-28 | Discharge: 2017-06-28 | Disposition: A | Discharge status: Home / Self Care | Payer: BLUE CROSS/BLUE SHIELD | Source: Ambulatory Visit | Attending: Osteopathic Medicine | Admitting: Osteopathic Medicine

## 2017-06-28 DIAGNOSIS — E042 Nontoxic multinodular goiter: Secondary | ICD-10-CM | POA: Insufficient documentation

## 2017-06-28 DIAGNOSIS — R9389 Abnormal findings on diagnostic imaging of other specified body structures: Secondary | ICD-10-CM | POA: Diagnosis not present

## 2017-08-30 DIAGNOSIS — M5030 Other cervical disc degeneration, unspecified cervical region: Secondary | ICD-10-CM | POA: Diagnosis not present

## 2017-10-08 DIAGNOSIS — R928 Other abnormal and inconclusive findings on diagnostic imaging of breast: Secondary | ICD-10-CM | POA: Diagnosis not present

## 2017-11-16 DIAGNOSIS — R072 Precordial pain: Secondary | ICD-10-CM | POA: Diagnosis not present

## 2017-11-16 DIAGNOSIS — R0602 Shortness of breath: Secondary | ICD-10-CM | POA: Diagnosis not present

## 2017-11-16 DIAGNOSIS — R079 Chest pain, unspecified: Secondary | ICD-10-CM | POA: Diagnosis not present

## 2017-11-17 MED ORDER — GENERIC EXTERNAL MEDICATION
10.00 | Status: DC
Start: ? — End: 2017-11-17

## 2017-11-17 MED ORDER — SODIUM CHLORIDE 0.9 % IV SOLN
10.00 | INTRAVENOUS | Status: DC
Start: ? — End: 2017-11-17

## 2017-12-31 DIAGNOSIS — M5030 Other cervical disc degeneration, unspecified cervical region: Secondary | ICD-10-CM | POA: Diagnosis not present

## 2018-01-23 DIAGNOSIS — R102 Pelvic and perineal pain: Secondary | ICD-10-CM | POA: Diagnosis not present

## 2018-02-04 DIAGNOSIS — Z9889 Other specified postprocedural states: Secondary | ICD-10-CM | POA: Diagnosis not present

## 2018-02-04 DIAGNOSIS — R102 Pelvic and perineal pain: Secondary | ICD-10-CM | POA: Diagnosis not present

## 2018-03-14 DIAGNOSIS — Z79899 Other long term (current) drug therapy: Secondary | ICD-10-CM | POA: Diagnosis not present

## 2018-03-14 DIAGNOSIS — Z79891 Long term (current) use of opiate analgesic: Secondary | ICD-10-CM | POA: Diagnosis not present

## 2018-03-14 DIAGNOSIS — Z87440 Personal history of urinary (tract) infections: Secondary | ICD-10-CM | POA: Diagnosis not present

## 2018-03-14 DIAGNOSIS — I10 Essential (primary) hypertension: Secondary | ICD-10-CM | POA: Diagnosis not present

## 2018-03-14 DIAGNOSIS — Z881 Allergy status to other antibiotic agents status: Secondary | ICD-10-CM | POA: Diagnosis not present

## 2018-03-14 DIAGNOSIS — M542 Cervicalgia: Secondary | ICD-10-CM | POA: Diagnosis not present

## 2018-03-14 DIAGNOSIS — I7090 Unspecified atherosclerosis: Secondary | ICD-10-CM | POA: Diagnosis not present

## 2018-03-14 DIAGNOSIS — Z888 Allergy status to other drugs, medicaments and biological substances status: Secondary | ICD-10-CM | POA: Diagnosis not present

## 2018-03-14 DIAGNOSIS — R0602 Shortness of breath: Secondary | ICD-10-CM | POA: Diagnosis not present

## 2018-03-14 DIAGNOSIS — R079 Chest pain, unspecified: Secondary | ICD-10-CM | POA: Diagnosis not present

## 2018-03-14 DIAGNOSIS — R002 Palpitations: Secondary | ICD-10-CM | POA: Diagnosis not present

## 2018-03-14 DIAGNOSIS — Z880 Allergy status to penicillin: Secondary | ICD-10-CM | POA: Diagnosis not present

## 2018-03-14 DIAGNOSIS — Q600 Renal agenesis, unilateral: Secondary | ICD-10-CM | POA: Diagnosis not present

## 2018-03-28 DIAGNOSIS — M961 Postlaminectomy syndrome, not elsewhere classified: Secondary | ICD-10-CM | POA: Diagnosis not present

## 2018-05-03 ENCOUNTER — Emergency Department
Admission: EM | Admit: 2018-05-03 | Discharge: 2018-05-03 | Disposition: A | Discharge status: Home / Self Care | Payer: BLUE CROSS/BLUE SHIELD | Source: Home / Self Care

## 2018-05-03 ENCOUNTER — Other Ambulatory Visit: Payer: Self-pay

## 2018-05-03 ENCOUNTER — Telehealth: Payer: BLUE CROSS/BLUE SHIELD | Admitting: Physician Assistant

## 2018-05-03 ENCOUNTER — Emergency Department (INDEPENDENT_AMBULATORY_CARE_PROVIDER_SITE_OTHER): Payer: BLUE CROSS/BLUE SHIELD

## 2018-05-03 DIAGNOSIS — I160 Hypertensive urgency: Secondary | ICD-10-CM | POA: Diagnosis not present

## 2018-05-03 DIAGNOSIS — E069 Thyroiditis, unspecified: Secondary | ICD-10-CM

## 2018-05-03 DIAGNOSIS — R221 Localized swelling, mass and lump, neck: Secondary | ICD-10-CM

## 2018-05-03 DIAGNOSIS — I10 Essential (primary) hypertension: Secondary | ICD-10-CM

## 2018-05-03 DIAGNOSIS — R1310 Dysphagia, unspecified: Secondary | ICD-10-CM

## 2018-05-03 LAB — TSH: TSH: 1.44 mIU/L

## 2018-05-03 LAB — POCT CBC W AUTO DIFF (K'VILLE URGENT CARE)

## 2018-05-03 LAB — POCT URINALYSIS DIP (MANUAL ENTRY)
Bilirubin, UA: NEGATIVE
Glucose, UA: NEGATIVE mg/dL
Ketones, POC UA: NEGATIVE mg/dL
Leukocytes, UA: NEGATIVE
Nitrite, UA: NEGATIVE
Protein Ur, POC: 30 mg/dL — AB
Spec Grav, UA: 1.01 (ref 1.010–1.025)
Urobilinogen, UA: 0.2 E.U./dL
pH, UA: 6 (ref 5.0–8.0)

## 2018-05-03 LAB — COMPLETE METABOLIC PANEL WITH GFR
AG Ratio: 2.2 (calc) (ref 1.0–2.5)
ALT: 18 U/L (ref 6–29)
AST: 15 U/L (ref 10–35)
Albumin: 5 g/dL (ref 3.6–5.1)
Alkaline phosphatase (APISO): 84 U/L (ref 37–153)
BUN: 12 mg/dL (ref 7–25)
CO2: 24 mmol/L (ref 20–32)
Calcium: 9.7 mg/dL (ref 8.6–10.4)
Chloride: 107 mmol/L (ref 98–110)
Creat: 0.79 mg/dL (ref 0.50–1.05)
GFR, Est African American: 100 mL/min/{1.73_m2} (ref >=60)
GFR, Est Non African American: 87 mL/min/{1.73_m2} (ref >=60)
Globulin: 2.3 g/dL (calc) (ref 1.9–3.7)
Glucose, Bld: 108 mg/dL — ABNORMAL HIGH (ref 65–99)
Potassium: 4 mmol/L (ref 3.5–5.3)
Sodium: 140 mmol/L (ref 135–146)
Total Bilirubin: 0.4 mg/dL (ref 0.2–1.2)
Total Protein: 7.3 g/dL (ref 6.1–8.1)

## 2018-05-03 MED ORDER — LABETALOL HCL 100 MG PO TABS: 100.0000 mg | ORAL_TABLET | Freq: Two times a day (BID) | ORAL | 1 refills | Status: DC

## 2018-05-03 NOTE — ED Triage Notes (Signed)
Pt states that about 6 weeks ago she swallowed hard, and "throid has been aggravated" since.  Pt feels that neck is swollen and it is hard to swallow.  Had an [place] 11 months ago that showed 2 nodules.

## 2018-05-03 NOTE — Discharge Instructions (Signed)
°  Please follow up with the medical provider who has been scheduling your thyroid scans.  If you need [place] to order an ultrasound, let us know but it is best to try to keep your records in as few locations as possible to help continuity of care.  Your blood pressure was extremely high today.  It is concerning it has been so high on multiple occasions. It can cause kidney failure and lead to dialysis, heart failure, stroke, and early death if not properly managed. You have been prescribed the lowest dose of the same blood pressure medication your were given in the emergency department in March of this year so you should do well, and not have any reactions.    Please establish care with a primary care provider for ongoing care of your thyroid checks, blood pressure, and sensation of having a lump in your throat. You may be referred to an [place] [facility] Throat specialist or gastroenterologist to have a scope put down your throat to help determine the cause of this ongoing sensation.  Call 911 or go to the hospital if symptoms worsen- difficulty breathing, unable to swallow, vomiting/gagging shortly after eating/drinking.

## 2018-05-03 NOTE — ED Provider Notes (Signed)
[facility]    CSN: [id number] Arrival date & time: 05/03/18  1002     History   Chief Complaint Chief Complaint  Patient presents with  . swollen neck    HPI Bonnie Peck is a 53 y.o. female.   HPI  Bonnie Peck is a 53 y.o. female presenting to UC with c/o gradually worsening sensation of swelling in her neck/throat when she swallows. Symptoms started after she was eating chicken. She felt like she did not chew a piece well enough and felt it get stuck for a few seconds.  Denies pain. She is concerned the incident aggravated her thyroid.  She has been told she has 2 nodules on her thyroid, seen on an ultrasound in June 2019. She is waiting to be scheduled for her annual follow up scan but was concerned about current sensation worsening.  Her ultrasound was scheduled by the orthopedic PA who she sees for cervical pain.  She does not currently have a PCP and has talked to her orthopedist about establishing care with an endocrinologist but has not scheduled an appointment yet.  She was seen at [facility] Emergency Department on 03/14/2018 for palpitations. Her TSH at that time was normal but her BP was 221/135, improved to 192/84 after given labetalol 20mg  IV. Per medical hx, pt is reluctant to accept a diagnosis of HTN. She has tried Lisinopril and Amlodipine in the past, which caused hives. She does not currently take any prescription medications. Denies HA, dizziness, chest pain, palpitations.   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    Past Medical History:  Diagnosis Date  . Hypertension     Patient Active Problem List   Diagnosis Date Noted  . Calculus of gallbladder without cholecystitis without obstruction 11/21/2016  . Globus sensation 11/21/2016  . Prepatellar bursitis of right knee 08/16/2015  . Hematuria 04/19/2015  . Essential hypertension 03/11/2015  . 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 APPENDECTOMY  age 40  . CESAREAN SECTION  1998  . KIDNEY SURGERY Left born   . RHINOPLASTY    . UTERINE SEPTUM RESECTION      OB History   No obstetric history on file.    Obstetric Comments  First menstrual 11         Home Medications    Prior to Admission medications   Medication Sig Start Date End Date Taking? Authorizing Provider  Cholecalciferol (VITAMIN D3) 1000 units CAPS Take by mouth.    [provider]  CRANBERRY PO Take by mouth.    [provider]  cyanocobalamin 100 MCG tablet Take 100 mcg daily by mouth.    [provider]  L-ARGININE PO Take by mouth.    [provider]  labetalol (NORMODYNE) 100 MG tablet Take 1 tablet (100 mg total) by mouth 2 (two) times daily. 05/03/18   Noe Gens, PA-C  MILK THISTLE EXTRACT PO Take by mouth.    [provider]  minocycline (MINOCIN,DYNACIN) 100 MG capsule Take 100 mg by mouth 2 (two) times daily.    [provider]  Probiotic Product (PRO-FLORA IMMUNE) CAPS Take by mouth.    [provider]  Rutin (RUTIN-50) 50 MG TABS Take 500 mg by mouth daily.     [provider]  thiamine (VITAMIN B-1) 100 MG tablet Take 100  mg by mouth daily.    [provider]    Family History Family History  Problem Relation Age of Onset  . Melanoma Maternal Grandmother   . Heart attack Maternal Grandmother   . Heart attack Mother   . Diabetes Mother   . Hypertension Mother   . Hyperlipidemia Father   . Colon cancer Father     Social History Social History   Tobacco Use  . Smoking status: Never Smoker  . Smokeless tobacco: Never Used  Substance Use Topics  . Alcohol use: Yes  . Drug use: No     Allergies   Cefaclor; Cefdinir; Ciprofloxacin; Lisinopril; Penicillins; Sulfa antibiotics; Amlodipine; Clindamycin/lincomycin; Cortisone; Hydrochlorothiazide;  Vancomycin; Corticosteroids; Azithromycin; and Erythromycin   Review of Systems Review of Systems  Constitutional: Negative for activity change, appetite change, chills and fatigue.  HENT: Positive for sore throat ("lump in throat") and trouble swallowing. Negative for congestion and voice change.   Respiratory: Negative for cough, shortness of breath and stridor.   Cardiovascular: Negative for chest pain and palpitations.  Gastrointestinal: Negative for abdominal pain, nausea and vomiting.  Musculoskeletal: Positive for neck pain and neck stiffness. Negative for arthralgias, back pain and myalgias.  Neurological: Negative for dizziness, syncope, weakness, light-headedness and headaches.     Physical Exam Triage Vital Signs ED Triage Vitals  Enc Vitals Group     BP 05/03/18 1023 (!) 191/128     Pulse Rate 05/03/18 1023 95     Resp 05/03/18 1023 20     Temp 05/03/18 1023 98.7 F (37.1 C)     Temp Source 05/03/18 1023 Oral     SpO2 05/03/18 1023 99 %     Weight 05/03/18 1024 190 lb (86.2 kg)     Height 05/03/18 1024 5\' 4"  (1.626 m)     Head Circumference --      Peak Flow --      Pain Score 05/03/18 1023 0     Pain Loc --      Pain Edu? --      Excl. in [place]? --    No data found.  Updated Vital Signs BP (!) 155/98   Pulse 95   Temp 98.7 F (37.1 C) (Oral)   Resp 20   Ht 5\' 4"  (1.626 m)   Wt 190 lb (86.2 kg)   SpO2 99%   BMI 32.61 kg/m   Visual Acuity Right Eye Distance:   Left Eye Distance:   Bilateral Distance:    Right Eye Near:   Left Eye Near:    Bilateral Near:     Physical Exam Vitals signs and nursing note reviewed.  Constitutional:      Appearance: Normal appearance. She is well-developed.  HENT:     Head: Normocephalic and atraumatic.     Right Ear: Tympanic membrane normal.     Left Ear: Tympanic membrane normal.     Nose: Nose normal.     Mouth/Throat:     Lips: Pink.     Mouth: Mucous membranes are moist.     Pharynx: Oropharynx is clear.  Uvula midline.  Neck:     Musculoskeletal: Normal range of motion and neck supple. No neck rigidity or muscular tenderness.     Thyroid: No thyroid mass, thyromegaly or thyroid tenderness.     Comments: No obvious thyromegaly or nodules noted on exam. Cardiovascular:     Rate and Rhythm: Normal rate and regular rhythm.  Pulmonary:     Effort: Pulmonary effort  is normal.     Breath sounds: Normal breath sounds. No stridor.  Musculoskeletal: Normal range of motion.  Lymphadenopathy:     Cervical: No cervical adenopathy.  Skin:    General: Skin is warm and dry.  Neurological:     Mental Status: She is alert and oriented to person, place, and time.  Psychiatric:        Behavior: Behavior normal.      UC Treatments / Results  Labs (all labs ordered are listed, but only abnormal results are displayed) Labs Reviewed  POCT URINALYSIS DIP (MANUAL ENTRY) - Abnormal; Notable for the following components:      Result Value   Blood, UA trace-intact (*)    Protein Ur, POC =30 (*)    All other components within normal limits  COMPLETE METABOLIC PANEL WITH GFR  TSH  POCT CBC W AUTO DIFF ([facility])    EKG None  Radiology Dg Neck Soft Tissue  Result Date: 05/03/2018 CLINICAL DATA:  Difficulty swallowing for several weeks with globus sensation. EXAM: NECK SOFT TISSUES - 1+ VIEW COMPARISON:  06/10/2017 cervical spine radiograph FINDINGS: There is no evidence of retropharyngeal soft tissue swelling or epiglottic enlargement. The cervical airway is unremarkable and no radio-opaque foreign body identified. No evidence of soft tissue emphysema. Moderate multilevel anterior osteophytes in the mid to lower cervical spine. IMPRESSION: No acute abnormality.  No radiopaque foreign body. Electronically Signed   By: Ilona Sorrel M.D.   On: 05/03/2018 10:50    Procedures Procedures (including critical care time)  Medications Ordered in UC Medications - No data to display  Initial  Impression / Assessment and Plan / UC Course  I have reviewed the triage vital signs and the nursing notes.  Pertinent labs & imaging results that were available during my care of the patient were reviewed by me and considered in my medical decision making (see chart for details).    CBC and UA: unremarkable BP improved from 191/128 to 155/98 on recheck after pt resting in exam room for several minutes after imaging study performed, however, BP is still high and considered HTN range. Reviewed imaging with pt, no explanation of symptoms found on imaging study. CMP and TSH- pending.  Pt notes her orthopedist PA who scheduled initial thyroid scan is working with her to schedule f/u scan. Encouraged pt to call that provider on Monday, 05/05/2018 to check on status, if needed, thyroid Ultrasound can be ordered at this urgent care.  Reviewed PMH. Due to chronicity and severity of elevated BP, provided printed prescription for labetalol, which she had in the emergency department on 03/14/2018 w/o reaction. Encouraged to start the medication to limit risk of organ damage and early death.  AVS and resource guide for PCP, endocrinology, ENT and GI provided.  Final Clinical Impressions(s) / UC Diagnoses   Final diagnoses:  Hypertensive urgency  Trouble swallowing  Sensation of lump in throat  Uncontrolled hypertension     Discharge Instructions      Please follow up with the medical provider who has been scheduling your thyroid scans.  If you need [place] to order an ultrasound, let us know but it is best to try to keep your records in as few locations as possible to help continuity of care.  Your blood pressure was extremely high today.  It is concerning it has been so high on multiple occasions. It can cause kidney failure and lead to dialysis, heart failure, stroke, and early death if not  properly managed. You have been prescribed the lowest dose of the same blood pressure medication your were given  in the emergency department in March of this year so you should do well, and not have any reactions.    Please establish care with a primary care provider for ongoing care of your thyroid checks, blood pressure, and sensation of having a lump in your throat. You may be referred to an [place] and Throat specialist or gastroenterologist to have a scope put down your throat to help determine the cause of this ongoing sensation.  Call 911 or go to the hospital if symptoms worsen- difficulty breathing, unable to swallow, vomiting/gagging shortly after eating/drinking.     ED Prescriptions    Medication Sig Dispense Auth. Provider   labetalol (NORMODYNE) 100 MG tablet Take 1 tablet (100 mg total) by mouth 2 (two) times daily. 30 tablet Noe Gens, PA-C     Controlled Substance Prescriptions [place] Controlled Substance Registry consulted? Not Applicable   Tyrell Antonio 05/03/18 1132

## 2018-05-03 NOTE — Progress Notes (Signed)
Based on what you shared with me, I feel your condition warrants further evaluation and I recommend that you be seen for a face to face office visit.  This is above the scope of what we are allowed to treat via e-visit. You need to be seen so assessment of thyroid size, tenderness and thyroid function can be determined so that the most appropriate treatments can be given.    NOTE: If you entered your credit card information for this eVisit, you will not be charged. You may see a "hold" on your card for the $35 but that hold will drop off and you will not have a charge processed.  If you are having a true medical emergency please call 911.  If you need an urgent face to face visit, [place] has four urgent care centers for your convenience.    PLEASE NOTE: THE [facility] LOCATIONS AND URGENT CARE CLINICS DO NOT HAVE THE TESTING FOR CORONAVIRUS COVID19 AVAILABLE.  IF YOU FEEL YOU NEED THIS TEST YOU MUST GO TO A TRIAGE LOCATION AT [place]   [email or web address] to reserve your spot online an avoid wait times  [facility] [address] [place], [place] [address] Modified hours of operation: Monday-Friday, 12 PM to 6 PM  Saturday & Sunday 10 AM to 4 PM *Across the street from [facility] (New Address!) [address] [place], [place] [address] *Just off [address], across the road from [place] hours of operation: Monday-Friday, 12 PM to 6 PM  Closed Saturday & Sunday  [facility]'s modified hours of operation will be in effect from May 1 until May 31   The following sites will take your insurance:  . [facility] [place] a Provider at this Location  [address] [place], [place] [address] . 10 am to 8 pm Monday-Friday . 12 pm to 8 pm Saturday-Sunday   . [facility] at [place] a Provider at this Location  [place], [place], [place] [address] . 8 am to 8 pm Monday-Friday . 9 am to 6 pm Saturday . 11 am to 6 pm Sunday   . [facility] at [place] Get Driving Directions  [address] [place], [place] [address] . 8 am to 8 pm Monday-Friday . 8 am to 4 pm Saturday-Sunday   Your e-visit answers were reviewed by a board certified advanced clinical practitioner to complete your personal care plan.  Thank you for using e-Visits.

## 2018-05-04 ENCOUNTER — Telehealth: Payer: Self-pay | Admitting: Emergency Medicine

## 2018-05-04 NOTE — Telephone Encounter (Signed)
Left VM that all labs wnl; asked her to call with questions/concerns.

## 2018-05-09 ENCOUNTER — Telehealth: Payer: Self-pay | Admitting: Emergency Medicine

## 2018-05-27 DIAGNOSIS — E042 Nontoxic multinodular goiter: Secondary | ICD-10-CM | POA: Diagnosis not present

## 2018-06-06 DIAGNOSIS — R0989 Other specified symptoms and signs involving the circulatory and respiratory systems: Secondary | ICD-10-CM | POA: Diagnosis not present

## 2018-06-06 DIAGNOSIS — H6983 Other specified disorders of Eustachian tube, bilateral: Secondary | ICD-10-CM | POA: Diagnosis not present

## 2018-06-06 DIAGNOSIS — N951 Menopausal and female climacteric states: Secondary | ICD-10-CM | POA: Diagnosis not present

## 2018-06-06 DIAGNOSIS — I10 Essential (primary) hypertension: Secondary | ICD-10-CM | POA: Diagnosis not present

## 2018-06-12 DIAGNOSIS — R0989 Other specified symptoms and signs involving the circulatory and respiratory systems: Secondary | ICD-10-CM | POA: Diagnosis not present

## 2018-06-12 DIAGNOSIS — R1310 Dysphagia, unspecified: Secondary | ICD-10-CM | POA: Diagnosis not present

## 2018-06-19 DIAGNOSIS — R1319 Other dysphagia: Secondary | ICD-10-CM | POA: Diagnosis not present

## 2018-07-07 DIAGNOSIS — N951 Menopausal and female climacteric states: Secondary | ICD-10-CM | POA: Diagnosis not present

## 2018-07-07 DIAGNOSIS — R0989 Other specified symptoms and signs involving the circulatory and respiratory systems: Secondary | ICD-10-CM | POA: Diagnosis not present

## 2018-07-07 DIAGNOSIS — B351 Tinea unguium: Secondary | ICD-10-CM | POA: Diagnosis not present

## 2018-07-07 DIAGNOSIS — I10 Essential (primary) hypertension: Secondary | ICD-10-CM | POA: Diagnosis not present

## 2018-07-22 DIAGNOSIS — R0989 Other specified symptoms and signs involving the circulatory and respiratory systems: Secondary | ICD-10-CM | POA: Diagnosis not present

## 2018-07-22 DIAGNOSIS — E042 Nontoxic multinodular goiter: Secondary | ICD-10-CM | POA: Diagnosis not present

## 2018-07-22 DIAGNOSIS — R599 Enlarged lymph nodes, unspecified: Secondary | ICD-10-CM | POA: Diagnosis not present

## 2018-08-01 DIAGNOSIS — E042 Nontoxic multinodular goiter: Secondary | ICD-10-CM | POA: Diagnosis not present

## 2018-08-01 DIAGNOSIS — R221 Localized swelling, mass and lump, neck: Secondary | ICD-10-CM | POA: Diagnosis not present

## 2018-08-01 DIAGNOSIS — I10 Essential (primary) hypertension: Secondary | ICD-10-CM | POA: Diagnosis not present

## 2018-08-22 DIAGNOSIS — E041 Nontoxic single thyroid nodule: Secondary | ICD-10-CM | POA: Diagnosis not present

## 2018-08-22 DIAGNOSIS — E042 Nontoxic multinodular goiter: Secondary | ICD-10-CM | POA: Diagnosis not present

## 2018-08-22 DIAGNOSIS — R221 Localized swelling, mass and lump, neck: Secondary | ICD-10-CM | POA: Diagnosis not present

## 2018-09-30 DIAGNOSIS — H5203 Hypermetropia, bilateral: Secondary | ICD-10-CM | POA: Diagnosis not present

## 2018-10-08 DIAGNOSIS — R0989 Other specified symptoms and signs involving the circulatory and respiratory systems: Secondary | ICD-10-CM | POA: Diagnosis not present

## 2018-10-08 DIAGNOSIS — B3781 Candidal esophagitis: Secondary | ICD-10-CM | POA: Diagnosis not present

## 2018-10-08 DIAGNOSIS — R221 Localized swelling, mass and lump, neck: Secondary | ICD-10-CM | POA: Diagnosis not present

## 2018-10-08 DIAGNOSIS — I10 Essential (primary) hypertension: Secondary | ICD-10-CM | POA: Diagnosis not present

## 2018-10-17 DIAGNOSIS — H16223 Keratoconjunctivitis sicca, not specified as Sjogren's, bilateral: Secondary | ICD-10-CM | POA: Diagnosis not present

## 2018-10-22 DIAGNOSIS — L918 Other hypertrophic disorders of the skin: Secondary | ICD-10-CM | POA: Diagnosis not present

## 2018-10-22 DIAGNOSIS — Z808 Family history of malignant neoplasm of other organs or systems: Secondary | ICD-10-CM | POA: Diagnosis not present

## 2018-10-22 DIAGNOSIS — D225 Melanocytic nevi of trunk: Secondary | ICD-10-CM | POA: Diagnosis not present

## 2018-10-22 DIAGNOSIS — D485 Neoplasm of uncertain behavior of skin: Secondary | ICD-10-CM | POA: Diagnosis not present

## 2018-12-15 DIAGNOSIS — L7211 Pilar cyst: Secondary | ICD-10-CM | POA: Diagnosis not present

## 2019-06-02 ENCOUNTER — Telehealth: Payer: Self-pay | Admitting: General Practice

## 2019-06-02 MED ORDER — GENERIC EXTERNAL MEDICATION
Status: DC
Start: ? — End: 2019-06-02

## 2019-06-02 MED ORDER — SODIUM CHLORIDE 0.9 % IV SOLN
10.00 | INTRAVENOUS | Status: DC
Start: ? — End: 2019-06-02

## 2019-06-02 NOTE — Telephone Encounter (Signed)
[facility] called to schedule patient for ED follow up.  They said they will have patient call us to schedule.

## 2019-06-10 ENCOUNTER — Telehealth: Payer: Self-pay | Admitting: Cardiology

## 2019-06-10 NOTE — Telephone Encounter (Signed)
New Message  Patient is calling in to speak with Debra. States that they have spoken previously about scheduling an appointment with Dr. Stanford Breed. States that husband is a current patient of Dr. Stanford Breed. Patient has has questions about getting a renal duplex and other test. Please give patient a call back to discuss.

## 2019-06-10 NOTE — Telephone Encounter (Signed)
Spoke with pt, her husband sees dr Stanford Breed and she was seen in the ER for hypertension. She went to see a cardiologist at [facility] because we could not see her soon enough. Now that she has seen them she is wanting to see dr Stanford Breed because she did not feel comfortable with them. Appointment scheduled with dr Stanford Breed.

## 2019-06-17 ENCOUNTER — Telehealth: Payer: Self-pay | Admitting: Allergy & Immunology

## 2019-06-17 NOTE — Telephone Encounter (Signed)
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 Patient would like evaluation after possible reaction to COVID vaccine. Please advise if testing is needed or initial appointment needs to be completed first.

## 2019-06-17 NOTE — Progress Notes (Signed)
Referring-Natalie Alexander DO Reason for referral-Hypertension  HPI: 54 yo female for evaluation of hypertension at request of [facility]. Recently evaluated at [facility] for hypertension that increased following Covid vaccine. Labs 5/21 showed Na 142, K 4.5, BUN 10, Cr 0.75. 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 Carotid dopplers 5/21 showed no significant stenosis. Echo 6/21 showed normal LV function.  Patient states that since her Covid vaccine in April she has had spikes in her blood pressure as high as 240/120.  Medications have been adjusted (she did not tolerate amlodipine).  Her blood pressure is now improving on combination of labetalol and losartan.  She denies dyspnea, chest pain, palpitations or syncope.  When she has her blood pressure spikes she notes clicking in her ears.  Current Outpatient Medications  Medication Sig Dispense Refill  . Cholecalciferol (VITAMIN D3) 1000 units CAPS Take by mouth.    . cyanocobalamin 100 MCG tablet Take 100 mcg daily by mouth.    . labetalol (NORMODYNE) 100 MG tablet Take 1 tablet (100 mg total) by mouth 2 (two) times daily. 30 tablet 1  . losartan (COZAAR) 50 MG tablet Take 50 mg by mouth daily.    Marland Kitchen NALTREXONE-BUPROPION HCL ER PO Take 3 mg by mouth. Patient takes daily     No current facility-administered medications for this visit.    Allergies  Allergen Reactions  . Cefaclor Diarrhea and Rash  . Cefdinir Diarrhea  . Ciprofloxacin Hives, Nausea And Vomiting and Rash  . Lisinopril Itching and Rash  . Penicillins Hives and Rash    Can take Keflex without problems  . Sulfa Antibiotics Hives and Rash  . Amlodipine Hives and Rash  . Clindamycin/Lincomycin Hives and Rash  . Cortisone Hives, Other (See Comments) and Swelling    She had hives and was given a cortisone shot. The shot made the hives worse.    Marland Kitchen Hydrochlorothiazide Other (See Comments)    Mouth dry   . Vancomycin Hives and Rash    (ALL MYCINS)   . Corticosteroids  Hives  . Azithromycin Hives and Rash  . Erythromycin Rash    Per patient she is allergic to "all myocins"     Past Medical History:  Diagnosis Date  . Hypertension   . Lung nodule   . Solitary kidney     Past Surgical History:  Procedure Laterality Date  . APPENDECTOMY  age 89  . CESAREAN SECTION  1998  . KIDNEY SURGERY Left born   . RHINOPLASTY    . UTERINE SEPTUM RESECTION      Social History   Socioeconomic History  . Marital status: Married    Spouse name: Not on file  . Number of children: 2  . Years of education: Not on file  . Highest education level: Not on file  Occupational History  . Not on file  Tobacco Use  . Smoking status: Never Smoker  . Smokeless tobacco: Never Used  Substance and Sexual Activity  . Alcohol use: Yes  . Drug use: No  . Sexual activity: Yes  Other Topics Concern  . Not on file  Social History Narrative  . Not on file   Social Determinants of Health   Financial Resource Strain:   . Difficulty of Paying Living Expenses:   Food Insecurity:   . Worried About Charity fundraiser in the Last Year:   . Arboriculturist in the Last Year:   Transportation Needs:   .  Lack of Transportation (Medical):   Marland Kitchen Lack of Transportation (Non-Medical):   Physical Activity:   . Days of Exercise per Week:   . Minutes of Exercise per Session:   Stress:   . Feeling of Stress :   Social Connections:   . Frequency of Communication with Friends and Family:   . Frequency of Social Gatherings with Friends and Family:   . Attends Religious Services:   . Active Member of Clubs or Organizations:   . Attends Archivist Meetings:   Marland Kitchen Marital Status:   Intimate Partner Violence:   . Fear of Current or Ex-Partner:   . Emotionally Abused:   Marland Kitchen Physically Abused:   . Sexually Abused:     Family History  Problem Relation Age of Onset  . Melanoma Maternal Grandmother   . Heart attack Maternal Grandmother   . Heart attack Mother   .  Diabetes Mother   . Hypertension Mother   . Hyperlipidemia Father   . Colon cancer Father     ROS: no fevers or chills, productive cough, hemoptysis, dysphasia, odynophagia, melena, hematochezia, dysuria, hematuria, rash, seizure activity, orthopnea, PND, pedal edema, claudication. Remaining systems are negative.  Physical Exam:   Blood pressure (!) 170/96, pulse 72, height 5\' 4"  (1.626 m), weight 174 lb 12.8 oz (79.3 kg), SpO2 99 %.  General:  Well developed/well nourished in NAD Skin warm/dry Patient not depressed No peripheral clubbing Back-normal HEENT-normal/normal eyelids Neck supple/normal carotid upstroke bilaterally; no bruits; no JVD; no thyromegaly chest - CTA/ normal expansion CV - RRR/normal S1 and S2; no murmurs, rubs or gallops;  PMI nondisplaced Abdomen -NT/ND, no HSM, no mass, + bowel sounds, no bruit 2+ femoral pulses, no bruits Ext-no edema, chords, 2+ DP Neuro-grossly nonfocal  ECG -sinus rhythm at a rate of 72, no ST changes.  Personally reviewed  A/P  1 Hypertension-blood pressure is elevated today but she states she is checking this regularly at home and it is improving on present regimen.  We will continue losartan and labetalol at present dose advance as needed.  She had renal Dopplers performed at [facility] and we will await those results particularly in light of solitary kidney.  If she continues to have spikes we will consider additional evaluation including work-up for pheochromocytoma.  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  Will need fu CTA 5/22.  3 h/o solitary kidney.  4 lung nodule-she is scheduled to see pulmonary for this.  Kirk Ruths, MD

## 2019-06-19 ENCOUNTER — Telehealth: Payer: Self-pay | Admitting: Cardiology

## 2019-06-19 NOTE — Telephone Encounter (Signed)
New Message:    Pt wants to know if her husband can come in with her on Wednesday for her appt with Dr Stanford Breed? Pt says she have a lot going and it is hard to remember things.

## 2019-06-19 NOTE — Telephone Encounter (Signed)
Advised OK for husband to come to new patient appt with Dr. Stanford Breed @ [facility] location

## 2019-06-22 NOTE — Telephone Encounter (Signed)
Pt si schduled for July 8th at 1030am with dr Maudie Mercury in [facility]

## 2019-06-22 NOTE — Telephone Encounter (Signed)
Let's do an initial visit first.   Salvatore Marvel, MD [facility] [place] [facility]

## 2019-06-24 ENCOUNTER — Other Ambulatory Visit: Payer: Self-pay

## 2019-06-24 ENCOUNTER — Encounter: Payer: Self-pay | Admitting: Cardiology

## 2019-06-24 ENCOUNTER — Ambulatory Visit (INDEPENDENT_AMBULATORY_CARE_PROVIDER_SITE_OTHER): Payer: BC Managed Care – PPO | Admitting: Cardiology

## 2019-06-24 VITALS — BP 170/96 | HR 72 | Ht 64.0 in | Wt 174.8 lb

## 2019-06-24 DIAGNOSIS — I10 Essential (primary) hypertension: Secondary | ICD-10-CM

## 2019-06-24 DIAGNOSIS — I712 Thoracic aortic aneurysm, without rupture, unspecified: Secondary | ICD-10-CM

## 2019-06-24 NOTE — Patient Instructions (Signed)
Medication Instructions:  NO CHANGE *If you need a refill on your cardiac medications before your next appointment, please call your pharmacy*   Lab Work: If you have labs (blood work) drawn today and your tests are completely normal, you will receive your results only by: Marland Kitchen MyChart Message (if you have MyChart) OR . A paper copy in the mail If you have any lab test that is abnormal or we need to change your treatment, we will call you to review the results.   Follow-Up: At [facility], you and your health needs are our priority.  As part of our continuing mission to provide you with exceptional heart care, we have created designated Provider Care Teams.  These Care Teams include your primary Cardiologist (physician) and Advanced Practice Providers (APPs -  Physician Assistants and Nurse Practitioners) who all work together to provide you with the care you need, when you need it.  We recommend signing up for the patient portal called "MyChart".  Sign up information is provided on this After Visit Summary.  MyChart is used to connect with patients for Virtual Visits (Telemedicine).  Patients are able to view lab/test results, encounter notes, upcoming appointments, etc.  Non-urgent messages can be sent to your provider as well.   To learn more about what you can do with MyChart, go to [email or web address].    Your next appointment:   3 month(s)  The format for your next appointment:   Either In Person or Virtual  Provider:   Kirk Ruths, MD

## 2019-07-09 ENCOUNTER — Encounter: Payer: Self-pay | Admitting: Allergy

## 2019-07-09 ENCOUNTER — Ambulatory Visit: Payer: Managed Care, Other (non HMO) | Admitting: Allergy

## 2019-07-09 ENCOUNTER — Other Ambulatory Visit: Payer: Self-pay

## 2019-07-09 VITALS — BP 150/80 | HR 71 | Resp 16 | Ht 65.5 in | Wt 173.0 lb

## 2019-07-09 DIAGNOSIS — H9313 Tinnitus, bilateral: Secondary | ICD-10-CM | POA: Diagnosis not present

## 2019-07-09 DIAGNOSIS — R030 Elevated blood-pressure reading, without diagnosis of hypertension: Secondary | ICD-10-CM

## 2019-07-09 DIAGNOSIS — Z872 Personal history of diseases of the skin and subcutaneous tissue: Secondary | ICD-10-CM | POA: Diagnosis not present

## 2019-07-09 DIAGNOSIS — T50Z95D Adverse effect of other vaccines and biological substances, subsequent encounter: Secondary | ICD-10-CM

## 2019-07-09 DIAGNOSIS — T50B95D Adverse effect of other viral vaccines, subsequent encounter: Secondary | ICD-10-CM | POA: Diagnosis not present

## 2019-07-09 DIAGNOSIS — T50Z95A Adverse effect of other vaccines and biological substances, initial encounter: Secondary | ICD-10-CM | POA: Insufficient documentation

## 2019-07-09 DIAGNOSIS — Z889 Allergy status to unspecified drugs, medicaments and biological substances status: Secondary | ICD-10-CM | POA: Insufficient documentation

## 2019-07-09 NOTE — Patient Instructions (Addendum)
.   You are fully vaccinated as of now which is great. . If there are boosters vaccine needed for COVID-19 in the future, we can have another discussion regarding the risks and benefits.  . Get bloodwork:  o We are ordering labs, so please allow 1-2 weeks for the results to come back. o With the newly implemented Cures Act, the labs might be visible to you at the same time that they become visible to me. However, I will not address the results until all of the results are back, so please be patient.    Follow up as needed.

## 2019-07-09 NOTE — Progress Notes (Signed)
New Patient Note  RE: Bonnie Peck MRN: [id number] DOB: Nov 11, 1965 Date of Office Visit: 07/09/2019  Referring provider: Eula Peck, * Primary care provider: Zara Chess, NP  Chief Complaint: Allergic Reaction  History of Present Illness: I had the pleasure of seeing Bonnie Peck for initial evaluation at the [facility] on 07/10/2019. She is a 54 y.o. female, who is referred here by Bonnie Chess, NP for the evaluation of Bonnie Peck COVID-19 vaccine reaction.  Patient had 2nd Pfizer COVID-19 vaccine on April 14th, 2021. She waited 15 minutes with no symptoms. However, a few days later she developed a high pitched tinnitus bilaterally which is still there today.  She also noticed that her blood pressure was going up and at one time it went up to 241/120. She went to the ER for this twice.  Since then she was started on losartan and labetalol and her blood pressure is still not as well controlled as previously.  Had reaction to amlodipine - inflamed red knees, abdominal pain so they stopped that medicine. Denies any puntius, rash, swelling.   She had hypertension previously which was well controlled with just weight loss. No previous Covid-19 diagnosis.  Patient was evaluated by ER - CT showed aneurysm.  She was evaluated by cardiology, ENT and she has an appointment to see pulmonology in July for the lung nodule.   Any known reactions to polyethylene glycol or polysorbate?  None  . Any history of anaphylaxis to vaccinations?  No issues with tetanus, flu vaccines in the past but she normally does NOT get these vaccines on a regular basis.  Any history of reactions to injectable medications? Cortisone injection caused hives over 20 years ago. She was being treated for some type of drug allergic reaction at that time.  Any history of anaphylaxis to colonoscopy preps (i.e.Miralax)? No issues with dulcolax.   Any history of dermal filler treatments in  the last year? Denies.  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   Assessment and Plan: [name] is a 54 y.o. female with: Immunization reaction Delayed non-IgE mediated reaction after the second [place] COVID-19 vaccine in the form of tinnitus, elevated blood pressure. Denies any associated pruritus, rash, hives or swelling. Incidentally found to have an aneurysm and lung nodule which apparently was not present in a CT from 2019. 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    Discussed with patient that her presentation is not typical of an IgE mediated vaccine reaction which is what the skin testing we have available tests for. Interestingly she broke out in hives after a cortisone injection 20 years ago but she was being treated for a drug reaction at that time as well. She has history of multiple adverse drug reactions.  Given the above clinical history, she does not meet criteria for component skin testing.   As of now she is fully vaccinated which is great.  If in the future there are booster vaccines that are required, I recommend that she holds off any additional COVID-19 vaccines until further evaluation.  Get bloodwork to rule out other etiologies including mast cell disorders.   Return if symptoms worsen or fail to improve.  Lab Orders     CBC with Differential/Platelet     Chronic Urticaria     C-reactive protein     Tryptase     Sedimentation rate     ANA w/Reflex  Other  allergy screening: Asthma: no Rhino conjunctivitis: no  Perennial sinus issues.  Food allergy: no Medication allergy: yes Hymenoptera allergy: no Urticaria: no Eczema:no History of recurrent infections suggestive of immunodeficency: no  Diagnostics: None.  Past Medical History: Patient Active Problem List   Diagnosis Date Noted  . Multiple drug allergies 07/09/2019  . History of urticaria 07/09/2019  . Immunization reaction 07/09/2019  .  Calculus of gallbladder without cholecystitis without obstruction 11/21/2016  . Globus sensation 11/21/2016  . Prepatellar bursitis of right knee 08/16/2015  . Hematuria 04/19/2015  . Essential hypertension 03/11/2015  . 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 Hypertension   . Lung nodule   . Solitary kidney    Past Surgical History: Past Surgical History:  Procedure Laterality Date  . APPENDECTOMY  age 84  . CESAREAN SECTION  1998  . KIDNEY SURGERY Left born   . RHINOPLASTY    . UTERINE SEPTUM RESECTION     Medication List:  Current Outpatient Medications  Medication Sig Dispense Refill  . Cholecalciferol (VITAMIN D3) 1000 units CAPS Take by mouth.    . cyanocobalamin 100 MCG tablet Take 100 mcg daily by mouth.    . labetalol (NORMODYNE) 100 MG tablet Take 1 tablet (100 mg total) by mouth 2 (two) times daily. 30 tablet 1  . NALTREXONE-BUPROPION HCL ER PO Take 3 mg by mouth. Patient takes daily    . losartan (COZAAR) 50 MG tablet Take 50 mg by mouth daily.     No current facility-administered medications for this visit.   Allergies: Allergies  Allergen Reactions  . Cefaclor Diarrhea and Rash  . Cefdinir Diarrhea  . Ciprofloxacin Hives, Nausea And Vomiting and Rash  . Lisinopril Itching and Rash  . Penicillins Hives and Rash    Can take Keflex without problems  . Sulfa Antibiotics Hives and Rash  . Amlodipine Hives and Rash  . Clindamycin/Lincomycin Hives and Rash  . Cortisone Hives, Other (See Comments) and Swelling    She had hives and was given a cortisone shot. The shot made the hives worse.    Marland Kitchen Hydrochlorothiazide Other (See Comments)    Mouth dry   . Vancomycin Hives and Rash    (ALL MYCINS)   . Corticosteroids Hives  . Azithromycin Hives and Rash  . Erythromycin Rash    Per patient she is allergic to "all myocins"   Social History: Social History   Socioeconomic History  . Marital status: Married     Spouse name: Not on file  . Number of children: 2  . Years of education: Not on file  . Highest education level: Not on file  Occupational History  . Not on file  Tobacco Use  . Smoking status: Never Smoker  . Smokeless tobacco: Never Used  Substance and Sexual Activity  . Alcohol use: Yes  . Drug use: No  . Sexual activity: Yes  Other Topics Concern  . Not on file  Social History Narrative  . Not on file   Social Determinants of Health   Financial Resource Strain:   . Difficulty of Paying Living Expenses:   Food Insecurity:   . Worried About Charity fundraiser in the Last Year:   . Arboriculturist in the Last Year:   Transportation Needs:   . Film/video editor (Medical):   Marland Kitchen Lack of Transportation (Non-Medical):   Physical Activity:   . Days of Exercise per Week:   .  Minutes of Exercise per Session:   Stress:   . Feeling of Stress :   Social Connections:   . Frequency of Communication with Friends and Family:   . Frequency of Social Gatherings with Friends and Family:   . Attends Religious Services:   . Active Member of Clubs or Organizations:   . Attends Archivist Meetings:   Marland Kitchen Marital Status:    Lives in a house. Smoking: denies Occupation: stay at home  Environmental History: Water Damage/mildew in the house: no Carpet in the family room: yes Carpet in the bedroom: yes Heating: electric Cooling: central Pet: yes 1 dog x 3 yrs  Family History: Family History  Problem Relation Age of Onset  . Melanoma Maternal Grandmother   . Heart attack Maternal Grandmother   . Heart attack Mother   . Diabetes Mother   . Hypertension Mother   . Hyperlipidemia Father   . Colon cancer Father    Problem                               Relation Asthma                                   denies Eczema                                denies Food allergy                          denies Allergic rhino conjunctivitis     Denies Drug allergy   Brother    Review of Systems  Constitutional: Negative for appetite change, chills, fever and unexpected weight change.  HENT: Positive for tinnitus. Negative for congestion and rhinorrhea.   Eyes: Negative for itching.  Respiratory: Negative for cough, chest tightness, shortness of breath and wheezing.   Cardiovascular: Negative for chest pain.  Gastrointestinal: Negative for abdominal pain.  Genitourinary: Negative for difficulty urinating.  Skin: Negative for rash.  Neurological: Negative for headaches.   Objective: BP (!) 150/80   Pulse 71   Resp 16   Ht 5' 5.5" (1.664 m)   Wt 173 lb (78.5 kg)   LMP  (LMP Unknown)   SpO2 97%   BMI 28.35 kg/m  Body mass index is 28.35 kg/m. Physical Exam Vitals and nursing note reviewed.  Constitutional:      Appearance: Normal appearance. She is well-developed.  HENT:     Head: Normocephalic and atraumatic.     Right Ear: Tympanic membrane and external ear normal.     Left Ear: Tympanic membrane and external ear normal.     Nose: Nose normal.     Mouth/Throat:     Mouth: Mucous membranes are moist.     Pharynx: Oropharynx is clear.  Eyes:     Conjunctiva/sclera: Conjunctivae normal.  Cardiovascular:     Rate and Rhythm: Normal rate and regular rhythm.     Heart sounds: Normal heart sounds. No murmur heard.  No friction rub. No gallop.   Pulmonary:     Effort: Pulmonary effort is normal.     Breath sounds: Normal breath sounds. No wheezing, rhonchi or rales.  Abdominal:     Palpations: Abdomen is soft.  Musculoskeletal:     Cervical back:  Neck supple.  Skin:    General: Skin is warm.     Findings: No rash.  Neurological:     Mental Status: She is alert and oriented to person, place, and time.  Psychiatric:        Mood and Affect: Mood normal.        Behavior: Behavior normal.    The plan was reviewed with the patient/family, and all questions/concerned were addressed.  It was my pleasure to see Desirae today and participate in  her care. Please feel free to contact me with any questions or concerns.  Sincerely,  Rexene Alberts, DO Allergy & Immunology  [facility] office: [phone] [facility] office: [place] office: [phone]

## 2019-07-10 ENCOUNTER — Encounter: Payer: Self-pay | Admitting: Allergy

## 2019-07-10 NOTE — Assessment & Plan Note (Addendum)
Delayed non-IgE mediated reaction after the second Pfizer COVID-19 vaccine in the form of tinnitus, elevated blood pressure. Denies any associated pruritus, rash, hives or swelling. Incidentally found to have an aneurysm and lung nodule which apparently was not present in a CT from 2019. 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    Discussed with patient that her presentation is not typical of an IgE mediated vaccine reaction which is what the skin testing we have available tests for. Interestingly she broke out in hives after a cortisone injection 20 years ago but she was being treated for a drug reaction at that time as well. She has history of multiple adverse drug reactions.  Given the above clinical history, she does not meet criteria for component skin testing.   As of now she is fully vaccinated which is great.  If in the future there are booster vaccines that are required, I recommend that she holds off any additional COVID-19 vaccines until further evaluation.  Get bloodwork to rule out other etiologies including mast cell disorders.

## 2019-07-17 ENCOUNTER — Encounter: Payer: Self-pay | Admitting: Emergency Medicine

## 2019-07-17 ENCOUNTER — Ambulatory Visit (INDEPENDENT_AMBULATORY_CARE_PROVIDER_SITE_OTHER): Payer: 59 | Admitting: Emergency Medicine

## 2019-07-17 ENCOUNTER — Other Ambulatory Visit: Payer: Self-pay

## 2019-07-17 DIAGNOSIS — R911 Solitary pulmonary nodule: Secondary | ICD-10-CM | POA: Diagnosis not present

## 2019-07-17 LAB — CBC WITH DIFFERENTIAL/PLATELET
Basophils Absolute: 0 10*3/uL (ref 0.0–0.2)
Basos: 0 %
EOS (ABSOLUTE): 0.1 10*3/uL (ref 0.0–0.4)
Eos: 1 %
Hematocrit: 38.4 % (ref 34.0–46.6)
Hemoglobin: 12.8 g/dL (ref 11.1–15.9)
Immature Grans (Abs): 0 10*3/uL (ref 0.0–0.1)
Immature Granulocytes: 0 %
Lymphocytes Absolute: 1.4 10*3/uL (ref 0.7–3.1)
Lymphs: 31 %
MCH: 29.6 pg (ref 26.6–33.0)
MCHC: 33.3 g/dL (ref 31.5–35.7)
MCV: 89 fL (ref 79–97)
Monocytes Absolute: 0.3 10*3/uL (ref 0.1–0.9)
Monocytes: 7 %
Neutrophils Absolute: 2.8 10*3/uL (ref 1.4–7.0)
Neutrophils: 61 %
Platelets: 292 10*3/uL (ref 150–450)
RBC: 4.32 x10E6/uL (ref 3.77–5.28)
RDW: 13.7 % (ref 11.7–15.4)
WBC: 4.6 10*3/uL (ref 3.4–10.8)

## 2019-07-17 LAB — CHRONIC URTICARIA: cu index: 7.1 (ref <10)

## 2019-07-17 LAB — TRYPTASE: Tryptase: 4.9 ug/L (ref 2.2–13.2)

## 2019-07-17 LAB — C-REACTIVE PROTEIN: CRP: <1 mg/L (ref 0–10)

## 2019-07-17 LAB — SEDIMENTATION RATE: Sed Rate: 3 mm/hr (ref 0–40)

## 2019-07-17 LAB — ANA W/REFLEX: Anti Nuclear Antibody (ANA): NEGATIVE

## 2019-07-17 NOTE — Patient Instructions (Signed)
You should not need any dedicated follow-up CT scan to track your small right pulmonary nodule because it is very low risk.  We will likely be able to follow it for stability anyway as you are not going to have repeat scans to follow your aortic aneurysm. We will obtain a copy of the images from your CT from [place] with Dr. Lamonte Sakai if needed

## 2019-07-17 NOTE — Assessment & Plan Note (Signed)
3 mm solid nodule at the right minor fissure in a low risk patient (never smoker, no family history of lung cancer, no personal history of cancer).  0.05 to 0.1% risk that this nodule will change.  She should not need any follow-up scans.  I will try to obtain a copy of her scan from [facility] so that I can review the actual images.  Have seen the report today.  She will likely have repeat chest imaging to follow her thoracic aortic aneurysm.  If so we will be able to ensure stability in nodule size over time.  She can follow-up with me as needed, in particular if there is a change in the size of this nodule or new nodule seen on her subsequent imaging.

## 2019-07-17 NOTE — Progress Notes (Signed)
Subjective:    Patient ID: Bonnie Peck, female    DOB: 14-Jul-1965, 54 y.o.   MRN: [id number]  HPI 54 year old never smoker with a history of hypertension, solitary kidney, TAA identified on CT chest that measured 4.1 x 4.1 cm.  Also noted on that CT scan was a 3 mm nodule in the right minor fissure.  No other nodules were reported.  She is here today to discuss the pulmonary nodule.    CT scan of the chest abdomen and pelvis 06/01/2019: INDICATION: Aortic disease, nontraumatic  ruling out dissection. Discrepency in blood pressure between right and left arms   Exam date/time: 06/01/2019 11:44 AM. Comparison none.   FINDINGS: Chest: Ascending aorta is mildly enlarged at 4.1 x 4.1 cm. Heart size is normal. Only mild atherosclerotic plaque. No evidence of coarctation. No adenopathy. No pleural or pericardial effusion. No pulmonary embolus or aortic dissection. 3 mm nodule along the right minor fissure image 48. Lungs are otherwise clear.   Abdomen/pelvis: No aortic dissection or aneurysm. Mild atherosclerotic disease. Single right kidney. Small renal cyst. No hydronephrosis. 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 No adnexal mass. No free fluid. No diverticulitis. Spleen, bilateral adrenal glands, and pancreas are unremarkable. Gallbladder present. No biliary ductal dilatation. No liver lesion.   IMPRESSION:  1. Small ascending aortic aneurysm. No dissection    Review of Systems As per HPI  Past Medical History:  Diagnosis Date  . Hypertension   . Lung nodule   . Solitary kidney      Family History  Problem Relation Age of Onset  . Melanoma Maternal Grandmother   . Heart attack Maternal Grandmother   . Heart attack Mother   . Diabetes Mother   . Hypertension Mother   . Hyperlipidemia Father   . Colon cancer Father       Social History   Socioeconomic History  . Marital status: Married    Spouse name: Not on file  . Number of children: 2  . Years of education: Not on file  .  Highest education level: Not on file  Occupational History  . Not on file  Tobacco Use  . Smoking status: Never Smoker  . Smokeless tobacco: Never Used  Substance and Sexual Activity  . Alcohol use: Yes  . Drug use: No  . Sexual activity: Yes  Other Topics Concern  . Not on file  Social History Narrative  . Not on file   Social Determinants of Health   Financial Resource Strain:   . Difficulty of Paying Living Expenses:   Food Insecurity:   . Worried About Charity fundraiser in the Last Year:   . Arboriculturist in the Last Year:   Transportation Needs:   . Film/video editor (Medical):   Marland Kitchen Lack of Transportation (Non-Medical):   Physical Activity:   . Days of Exercise per Week:   . Minutes of Exercise per Session:   Stress:   . Feeling of Stress :   Social Connections:   . Frequency of Communication with Friends and Family:   . Frequency of Social Gatherings with Friends and Family:   . Attends Religious Services:   . Active Member of Clubs or Organizations:   . Attends Archivist Meetings:   Marland Kitchen Marital Status:   Intimate Partner Violence:   . Fear of Current or Ex-Partner:   . Emotionally Abused:   Marland Kitchen Physically Abused:   . Sexually Abused:      Allergies  Allergen Reactions  . Cefaclor Diarrhea and Rash  . Cefdinir Diarrhea  . Ciprofloxacin Hives, Nausea And Vomiting and Rash  . Lisinopril Itching and Rash  . Penicillins Hives and Rash    Can take Keflex without problems  . Sulfa Antibiotics Hives and Rash  . Amlodipine Hives and Rash  . Clindamycin/Lincomycin Hives and Rash  . Cortisone Hives, Other (See Comments) and Swelling    She had hives and was given a cortisone shot. The shot made the hives worse.    Marland Kitchen Hydrochlorothiazide Other (See Comments)    Mouth dry   . Vancomycin Hives and Rash    (ALL MYCINS)   . Corticosteroids Hives  . Azithromycin Hives and Rash  . Erythromycin Rash    Per patient she is allergic to "all myocins"       Outpatient Medications Prior to Visit  Medication Sig Dispense Refill  . Cholecalciferol (VITAMIN D3) 1000 units CAPS Take by mouth.    . cyanocobalamin 100 MCG tablet Take 100 mcg daily by mouth.    . labetalol (NORMODYNE) 100 MG tablet Take 1 tablet (100 mg total) by mouth 2 (two) times daily. (Patient taking differently: Take 100 mg by mouth in the morning, at noon, in the evening, and at bedtime. ) 30 tablet 1  . losartan (COZAAR) 50 MG tablet Take 50 mg by mouth daily.    Marland Kitchen NALTREXONE-BUPROPION HCL ER PO Take 3 mg by mouth. Patient takes daily     No facility-administered medications prior to visit.        Objective:   Physical Exam Vitals:   07/17/19 1456  BP: (!) 158/82  Pulse: 68  Temp: 98.6 F (37 C)  TempSrc: Oral  SpO2: 98%  Weight: 174 lb (78.9 kg)  Height: 5\' 4"  (1.626 m)   Gen: Pleasant, well-nourished, in no distress,  normal affect  ENT: No lesions,  mouth clear,  oropharynx clear, no postnasal drip  Neck: No JVD, no stridor  Lungs: No use of accessory muscles, no crackles or wheezing on normal respiration, no wheeze on forced expiration  Cardiovascular: RRR, heart sounds normal, no murmur or gallops, no peripheral edema  Musculoskeletal: No deformities, no cyanosis or clubbing  Neuro: alert, awake, non focal  Skin: Warm, no lesions or rash       Assessment & Plan:  Solitary pulmonary nodule 3 mm solid nodule at the right minor fissure in a low risk patient (never smoker, no family history of lung cancer, no personal history of cancer).  0.05 to 0.1% risk that this nodule will change.  She should not need any follow-up scans.  I will try to obtain a copy of her scan from [facility] so that I can review the actual images.  Have seen the report today.  She will likely have repeat chest imaging to follow her thoracic aortic aneurysm.  If so we will be able to ensure stability in nodule size over time.  She can follow-up with me as needed, in particular  if there is a change in the size of this nodule or new nodule seen on her subsequent imaging.  Baltazar Apo, MD, PhD 07/17/2019, 3:25 PM [facility] [phone] or if no answer [phone]

## 2019-07-18 DIAGNOSIS — I10 Essential (primary) hypertension: Secondary | ICD-10-CM

## 2019-07-23 ENCOUNTER — Telehealth: Payer: Self-pay

## 2019-07-23 MED ORDER — CANDESARTAN CILEXETIL 32 MG PO TABS: 32.0000 mg | ORAL_TABLET | Freq: Every day | ORAL | 1 refills | Status: DC

## 2019-07-23 MED ORDER — LABETALOL HCL 100 MG PO TABS: ORAL_TABLET | ORAL | 1 refills | Status: DC

## 2019-07-23 NOTE — Telephone Encounter (Signed)
Pt called stating that they needed to speak to raquel regarding swelling and needing to change meds

## 2019-07-23 NOTE — Telephone Encounter (Signed)
Patient reports swollen neck and face with labetalol. BP up to 200s few nights ago and having gallstone flair-up.  Recommendation: 1. Wean off labetalol 100mg   Labetalol 100mg  TID today, then 100mg  BID x 3 days, then 100mg  daily x 5 days, then stop 2. STOP losartan - last dose today 3. Start taking candesartan 32mg  every moring   Keep appointment with HTN clinic scheduled for Aug/5ht

## 2019-07-24 IMAGING — DX NECK SOFT TISSUES - 1+ VIEW
2 series · 2 of 2 positions shown · non-contrast
Comparison: 06/10/2017 cervical spine radiograph

CLINICAL DATA: Difficulty swallowing for several weeks with globus
sensation.

EXAM:
NECK SOFT TISSUES - 1+ VIEW

[neck lat]
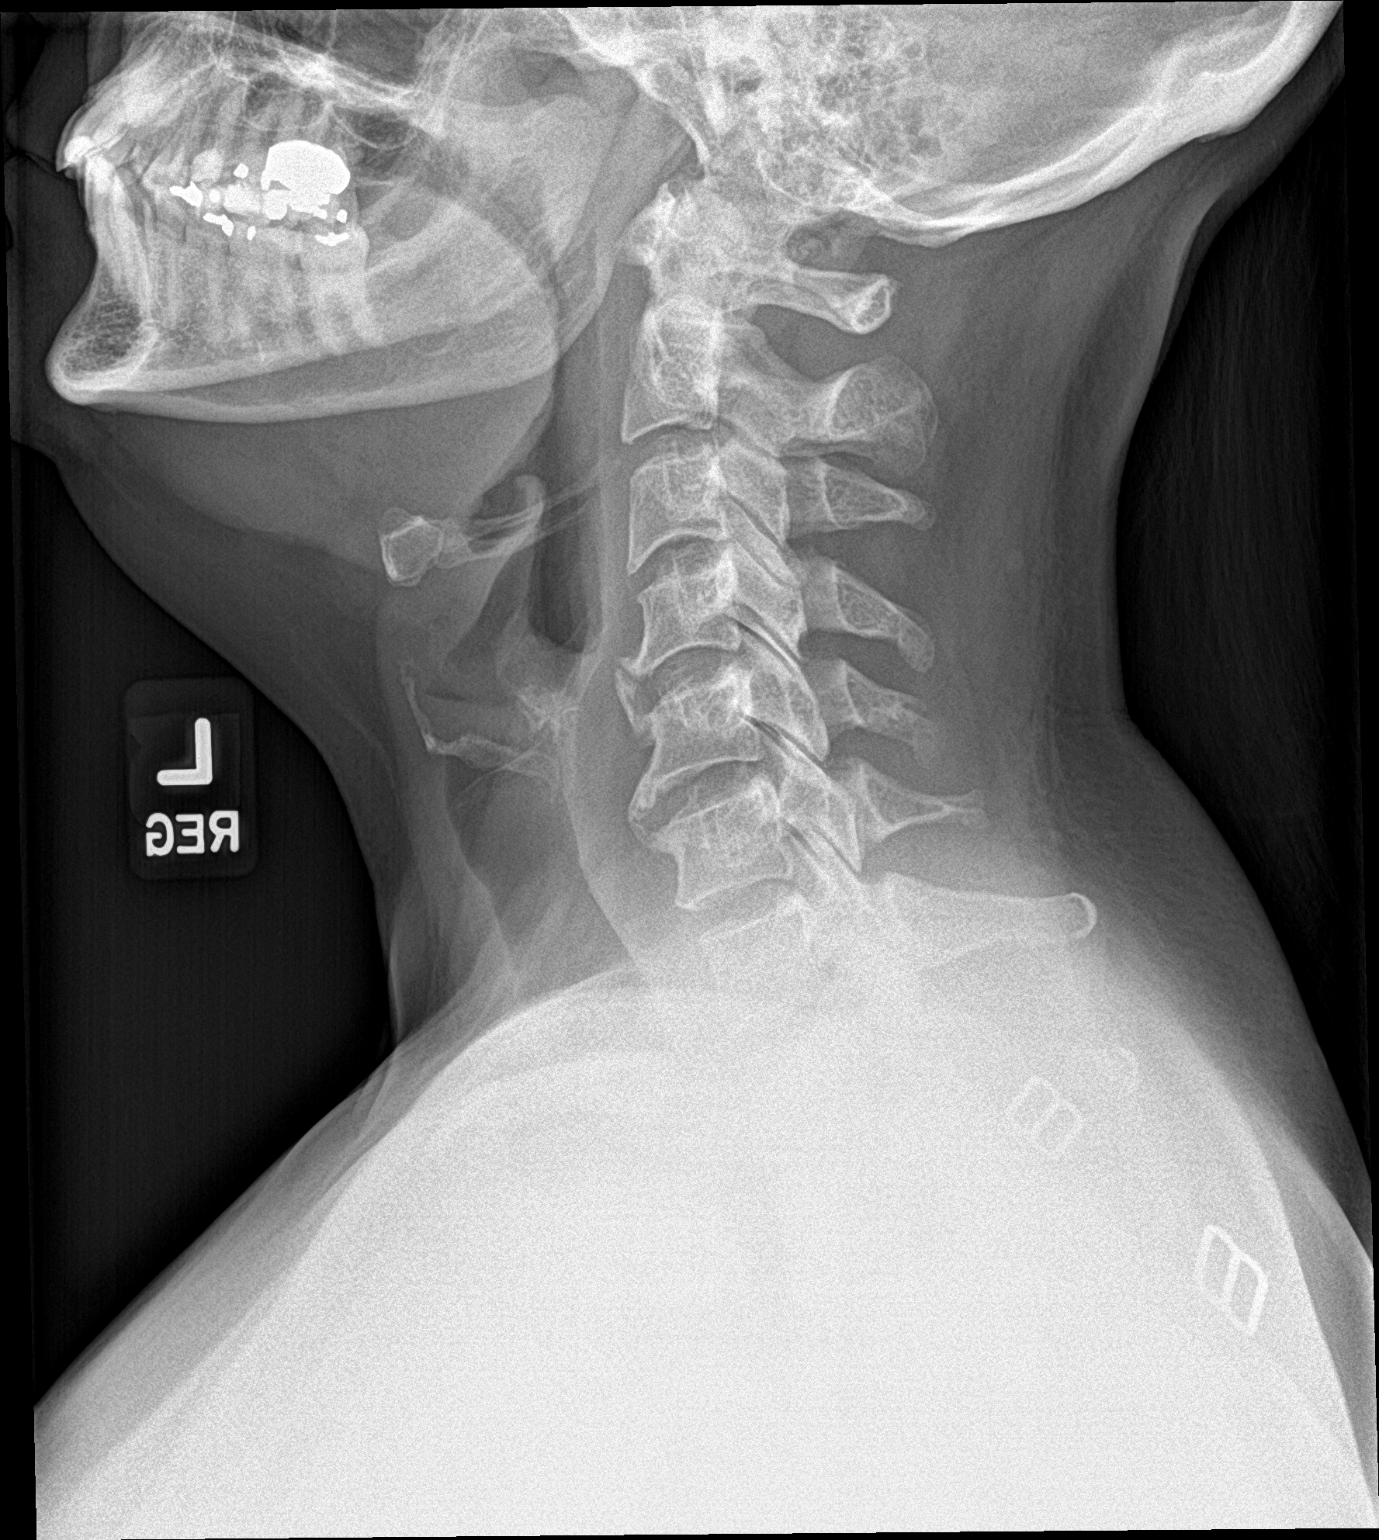

[neck ap]
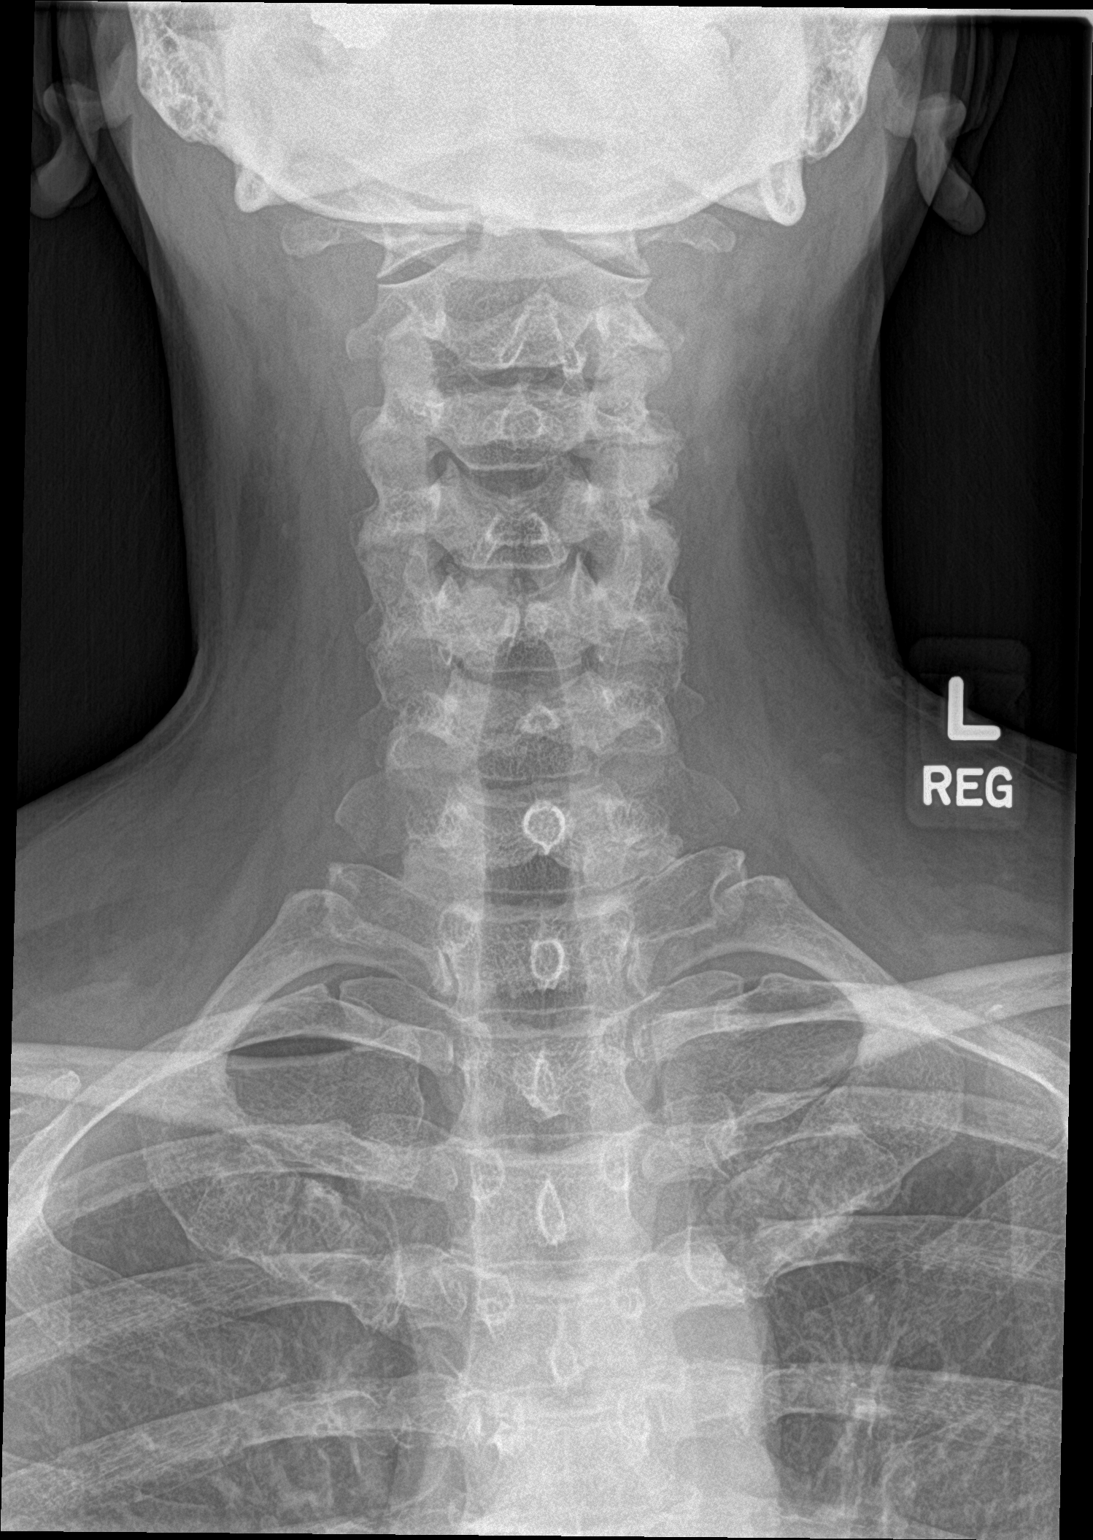

[2 of 2 positions shown; findings below may reference images not displayed]

FINDINGS: There is no evidence of retropharyngeal soft tissue swelling or
epiglottic enlargement. The cervical airway is unremarkable and no
radio-opaque foreign body identified. No evidence of soft tissue
emphysema. Moderate multilevel anterior osteophytes in the mid to
lower cervical spine.
IMPRESSION: No acute abnormality.  No radiopaque foreign body.

## 2019-07-28 ENCOUNTER — Encounter: Payer: Self-pay | Admitting: Pharmacist

## 2019-07-28 ENCOUNTER — Telehealth: Payer: Self-pay | Admitting: Pharmacist

## 2019-07-28 MED ORDER — VALSARTAN 160 MG PO TABS: 160.0000 mg | ORAL_TABLET | Freq: Every day | ORAL | 0 refills | Status: DC

## 2019-07-28 NOTE — Telephone Encounter (Signed)
Mouth ulcers and vision changes with candesartan.  Will STOP medication, add to allergy list and start valsartan 160mg  every morning.

## 2019-07-31 ENCOUNTER — Telehealth: Payer: Self-pay | Admitting: Pharmacist

## 2019-07-31 MED ORDER — VALSARTAN 160 MG PO TABS: 160.0000 mg | ORAL_TABLET | Freq: Two times a day (BID) | ORAL | 0 refills | Status: DC

## 2019-07-31 MED ORDER — LABETALOL HCL 100 MG PO TABS: 100.0000 mg | ORAL_TABLET | Freq: Two times a day (BID) | ORAL | 1 refills | Status: DC

## 2019-07-31 NOTE — Telephone Encounter (Signed)
See mychart message for details.

## 2019-08-06 ENCOUNTER — Ambulatory Visit (INDEPENDENT_AMBULATORY_CARE_PROVIDER_SITE_OTHER): Payer: 59 | Admitting: Pharmacist

## 2019-08-06 ENCOUNTER — Other Ambulatory Visit: Payer: Self-pay

## 2019-08-06 VITALS — BP 128/84 | HR 72 | Ht 64.0 in

## 2019-08-06 DIAGNOSIS — I10 Essential (primary) hypertension: Secondary | ICD-10-CM

## 2019-08-06 NOTE — Progress Notes (Signed)
Patient ID: Bonnie Peck                 DOB: June 30, 1965                      MRN: [id number]     HPI: Bonnie Peck is a 54 y.o. female referred by Dr. Stanford Breed to HTN clinic. PMH includes hypertension, left kidney absent, multiple drug allergies, TAA, chronic neck pain, and lung nodule. Patient presents for initial evaluation at HTN clinic and medication titration.   Noted adverse reaction to multiple medication. Stopped taking valsartan d/t lack of therapeutic response. She still taking labetalol 100mg  BID to control blood pressure., but dose was significantly decreased d/t neck swelling. No swelling, dizziness, chest plan, or  blurry vision reported during visit. Also noted, drop in BP readying if patient rest head slightly tilted back. Bonnie Peck added nittokinase to her therapy as well and denies problems with current therapy.  Current HTN meds:  Labetalol 100mg  twice daily Nattokinase 2000 units twice daily  Previously tried:  Amlodipine - hives Lisinopril - itching and rash HCTZ - dry mouth cansedartan 32 - mouth ulcers and vision changes Valsartan - lack therapeutic response  BP goal: <130/80  Family History: heart attach in mother and maternal grandmother, diabetes and hypertension in mother, hyperlipidemia and colon cancer in father.  Social History: denies tobacco use, reports occasional alcohol intake  Diet: avoids added sodium and caffeine , mainly home cooked meals, rich in fruits and vegetables  Exercise: frequent walks  Home BP readings:  15 readings; average 148/90 (range 681 to 275 systolic), HR range 17-00 bpm Noted significant drop in BP readying in the last 3 days  Home BP cuff (arm cuff Omron); accurate within 4mmHg from manual readying  Wt Readings from Last 3 Encounters:  07/17/19 174 lb (78.9 kg)  07/09/19 173 lb (78.5 kg)  06/24/19 174 lb 12.8 oz (79.3 kg)   BP Readings from Last 3 Encounters:  08/06/19 128/84  07/17/19 (!) 158/82  07/09/19 (!)  150/80   Pulse Readings from Last 3 Encounters:  08/06/19 72  07/17/19 68  07/09/19 71    Past Medical History:  Diagnosis Date  . Hypertension   . Lung nodule   . Solitary kidney     Current Outpatient Medications on File Prior to Visit  Medication Sig Dispense Refill  . Cholecalciferol (VITAMIN D3) 1000 units CAPS Take by mouth.    . cyanocobalamin 100 MCG tablet Take 100 mcg daily by mouth.    Marland Kitchen NALTREXONE-BUPROPION HCL ER PO Take 3 mg by mouth. Patient takes daily    . valsartan (DIOVAN) 160 MG tablet Take 1 tablet (160 mg total) by mouth 2 (two) times daily. 30 tablet 0   No current facility-administered medications on file prior to visit.    Allergies  Allergen Reactions  . Cefaclor Diarrhea and Rash  . Cefdinir Diarrhea  . Ciprofloxacin Hives, Nausea And Vomiting and Rash  . Lisinopril Itching and Rash  . Penicillins Hives and Rash    Can take Keflex without problems  . Sulfa Antibiotics Hives and Rash  . Amlodipine Hives and Rash  . Clindamycin/Lincomycin Hives and Rash  . Cortisone Hives, Other (See Comments) and Swelling    She had hives and was given a cortisone shot. The shot made the hives worse.    Marland Kitchen Hydrochlorothiazide Other (See Comments)    Mouth dry   . Vancomycin Hives and Rash    (  ALL MYCINS)   . Candesartan     Mouth ulcers and vision changes  . Corticosteroids Hives  . Azithromycin Hives and Rash  . Erythromycin Rash    Per patient she is allergic to "all myocins"    Blood pressure 128/84, pulse 72, height 5\' 4"  (1.626 m), SpO2 99 %.  Essential hypertension Blood pressure greatly improved since last OV. Patient was unabel to tolerate any ARB. Now on labetalol twice daily and nattokinase.  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 Noted significant drop  BP readying with neck/head repositioning.   Will continue labetalol 100mg  BID and nattokinase per patient's request d/t significant  improvement in swelling and BP readings.  We discussed option to change beta-blocker to carvedilol 12.5mg  twice daily for long acting effect if needed. I will verify idea Nattokinase dose for BP management for long term as well.   Recommendation given to increase water intake to 3-4 liter per day, continue low sodium diet and continue daily walks. Patient should follow up with orthopedic doctor for re-assessment of neck pain management.    Rashaad Hallstrom Rodriguez-Guzman PharmD, BCPS, [place] [id number] 08/07/2019 2:49 PM

## 2019-08-06 NOTE — Patient Instructions (Addendum)
Return for a follow up appointment in 4-5wks  Go to the lab in [place] your blood pressure at home daily (if able) and keep record of the readings.  Take your BP meds as follows: *NO CHANGE TODAY*  *Plan to change labetalol to 100mg  to carvedilol 12.5mg  twice if needed* *INCREASE water intake to 4 tumblers *  *Schedule f/u for neck compression when possible*  Bring all of your meds, your BP cuff and your record of home blood pressures to your next appointment.  Exercise as you're able, try to walk approximately 30 minutes per day.  Keep salt intake to a minimum, especially watch canned and prepared boxed foods.  Eat more fresh fruits and vegetables and fewer canned items.  Avoid eating in fast food restaurants.    HOW TO TAKE YOUR BLOOD PRESSURE: . Rest 5 minutes before taking your blood pressure. .  Don't smoke or drink caffeinated beverages for at least 30 minutes before. . Take your blood pressure before (not after) you eat. . Sit comfortably with your back supported and both feet on the floor (don't cross your legs). . Elevate your arm to heart level on a table or a desk. . Use the proper sized cuff. It should fit smoothly and snugly around your bare upper arm. There should be enough room to slip a fingertip under the cuff. The bottom edge of the cuff should be 1 inch above the crease of the elbow. . Ideally, take 3 measurements at one sitting and record the average.

## 2019-08-07 ENCOUNTER — Other Ambulatory Visit: Payer: Self-pay | Admitting: Pharmacist

## 2019-08-07 LAB — TSH+T4F+T3FREE
Free T4: 1.28 ng/dL (ref 0.82–1.77)
T3, Free: 3.4 pg/mL (ref 2.0–4.4)
TSH: 0.782 u[IU]/mL (ref 0.450–4.500)

## 2019-08-07 LAB — BASIC METABOLIC PANEL
BUN/Creatinine Ratio: 12 (ref 9–23)
BUN: 10 mg/dL (ref 6–24)
CO2: 22 mmol/L (ref 20–29)
Calcium: 9.8 mg/dL (ref 8.7–10.2)
Chloride: 104 mmol/L (ref 96–106)
Creatinine, Ser: 0.82 mg/dL (ref 0.57–1.00)
GFR calc Af Amer: 95 mL/min/{1.73_m2} (ref >59)
GFR calc non Af Amer: 83 mL/min/{1.73_m2} (ref >59)
Glucose: 89 mg/dL (ref 65–99)
Potassium: 4.7 mmol/L (ref 3.5–5.2)
Sodium: 141 mmol/L (ref 134–144)

## 2019-08-07 MED ORDER — CARVEDILOL 12.5 MG PO TABS: 12.5000 mg | ORAL_TABLET | Freq: Two times a day (BID) | ORAL | 1 refills | Status: DC

## 2019-08-07 NOTE — Telephone Encounter (Signed)
Your patient 

## 2019-08-07 NOTE — Assessment & Plan Note (Signed)
Blood pressure greatly improved since last OV. Patient was unabel to tolerate any ARB. Now on labetalol twice daily and nattokinase.  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 Noted significant drop  BP readying with neck/head repositioning.   Will continue labetalol 100mg  BID and nattokinase per patient's request d/t significant improvement in swelling and BP readings.  We discussed option to change beta-blocker to carvedilol 12.5mg  twice daily for long acting effect if needed. I will verify idea Nattokinase dose for BP management for long term as well.   Recommendation given to increase water intake to 3-4 liter per day, continue low sodium diet and continue daily walks. Patient should follow up with orthopedic doctor for re-assessment of neck pain management.

## 2019-08-19 DIAGNOSIS — I10 Essential (primary) hypertension: Secondary | ICD-10-CM

## 2019-08-25 ENCOUNTER — Telehealth: Payer: Self-pay | Admitting: Pharmacist

## 2019-08-25 MED ORDER — CLONIDINE HCL 0.1 MG PO TABS: 0.1000 mg | ORAL_TABLET | Freq: Two times a day (BID) | ORAL | 1 refills | Status: DC

## 2019-08-25 NOTE — Telephone Encounter (Signed)
Rx for clonidine mg send to prefer pharmacy.

## 2019-08-26 DIAGNOSIS — I714 Abdominal aortic aneurysm, without rupture, unspecified: Secondary | ICD-10-CM

## 2019-08-26 DIAGNOSIS — I712 Thoracic aortic aneurysm, without rupture, unspecified: Secondary | ICD-10-CM

## 2019-08-27 ENCOUNTER — Ambulatory Visit: Payer: 59

## 2019-08-27 LAB — METANEPHRINES, PLASMA
Metanephrine, Free: 31.8 pg/mL (ref 0.0–88.0)
Normetanephrine, Free: 88.4 pg/mL (ref 0.0–136.8)

## 2019-08-27 LAB — ALDOSTERONE + RENIN ACTIVITY W/ RATIO
ALDOS/RENIN RATIO: >95.2 — ABNORMAL HIGH (ref 0.0–30.0)
ALDOSTERONE: 15.9 ng/dL (ref 0.0–30.0)
Renin: <0.167 ng/mL/hr — ABNORMAL LOW (ref 0.167–5.380)

## 2019-08-28 DIAGNOSIS — D3500 Benign neoplasm of unspecified adrenal gland: Secondary | ICD-10-CM

## 2019-08-31 ENCOUNTER — Telehealth: Payer: Self-pay | Admitting: Pharmacist

## 2019-08-31 MED ORDER — LABETALOL HCL 100 MG PO TABS: ORAL_TABLET | ORAL | 0 refills | Status: DC

## 2019-08-31 MED ORDER — SPIRONOLACTONE 25 MG PO TABS: 25.0000 mg | ORAL_TABLET | Freq: Every day | ORAL | 0 refills | Status: DC

## 2019-08-31 NOTE — Addendum Note (Signed)
Addended by: Cristopher Estimable on: 08/31/2019 12:51 PM   Modules accepted: Orders

## 2019-08-31 NOTE — Telephone Encounter (Signed)
Will initiate spironolactone 25mg  daily for BP management on patient with low renin and elevated renin/angiotensin ratio

## 2019-09-01 ENCOUNTER — Other Ambulatory Visit: Payer: Self-pay | Admitting: Cardiology

## 2019-09-04 ENCOUNTER — Ambulatory Visit
Admission: RE | Admit: 2019-09-04 | Discharge: 2019-09-04 | Disposition: A | Discharge status: Home / Self Care | Payer: 59 | Source: Ambulatory Visit | Attending: Cardiology | Admitting: Cardiology

## 2019-09-04 DIAGNOSIS — I714 Abdominal aortic aneurysm, without rupture, unspecified: Secondary | ICD-10-CM

## 2019-09-04 DIAGNOSIS — I712 Thoracic aortic aneurysm, without rupture, unspecified: Secondary | ICD-10-CM

## 2019-09-04 MED ORDER — IOPAMIDOL (ISOVUE-370) INJECTION 76%
75.0000 mL | Freq: Once | INTRAVENOUS | Status: AC | PRN
  Administered 2019-09-04: 75 mL via INTRAVENOUS

## 2019-09-10 ENCOUNTER — Other Ambulatory Visit: Payer: Self-pay | Admitting: Pharmacist

## 2019-09-10 ENCOUNTER — Ambulatory Visit (INDEPENDENT_AMBULATORY_CARE_PROVIDER_SITE_OTHER): Payer: 59 | Admitting: Pharmacist

## 2019-09-10 ENCOUNTER — Other Ambulatory Visit: Payer: Self-pay

## 2019-09-10 VITALS — BP 162/90 | HR 71 | Resp 14 | Ht 64.0 in | Wt 174.0 lb

## 2019-09-10 DIAGNOSIS — F419 Anxiety disorder, unspecified: Secondary | ICD-10-CM | POA: Diagnosis not present

## 2019-09-10 DIAGNOSIS — I10 Essential (primary) hypertension: Secondary | ICD-10-CM

## 2019-09-10 LAB — BASIC METABOLIC PANEL
BUN/Creatinine Ratio: 10 (ref 9–23)
BUN: 9 mg/dL (ref 6–24)
CO2: 23 mmol/L (ref 20–29)
Calcium: 10.2 mg/dL (ref 8.7–10.2)
Chloride: 101 mmol/L (ref 96–106)
Creatinine, Ser: 0.91 mg/dL (ref 0.57–1.00)
GFR calc Af Amer: 84 mL/min/{1.73_m2} (ref >59)
GFR calc non Af Amer: 73 mL/min/{1.73_m2} (ref >59)
Glucose: 89 mg/dL (ref 65–99)
Potassium: 4.5 mmol/L (ref 3.5–5.2)
Sodium: 140 mmol/L (ref 134–144)

## 2019-09-10 MED ORDER — SPIRONOLACTONE 25 MG PO TABS
25.0000 mg | ORAL_TABLET | Freq: Every day | ORAL | 2 refills | Status: DC
Start: 2019-09-10 — End: 2019-10-13

## 2019-09-10 MED ORDER — LOSARTAN POTASSIUM 50 MG PO TABS: 50.0000 mg | ORAL_TABLET | Freq: Every day | ORAL | 1 refills | Status: DC

## 2019-09-10 MED ORDER — HYDRALAZINE HCL 25 MG PO TABS: ORAL_TABLET | ORAL | 1 refills | Status: DC

## 2019-09-10 NOTE — Progress Notes (Signed)
Patient ID: [name]                 DOB: 10-24-1965                      MRN: [id number]     HPI: Bonnie Peck is a 54 y.o. female referred by Dr. Stanford Peck to HTN clinic. PMH includes hypertension, left kidney absent, multiple drug allergies, TAA, chronic neck pain, and lung nodule. Patient presents for HTN follow up and medication titration. Noted adverse reaction to multiple medication (see below for details). Patient stopped taking valsartan and carvedilol after only couple of doses d/t lack of therapeutic response. She is currently out of labetalol and was experiencing side effects as well; therefore , we will NOT re-challenge. Bonnie Peck added nittokinase to her therapy an denies any problems with spironolactone 25mg  or natural compound.  Her blood work revealed an increased renin/aldosterone ratio, but not overproduction of either component;  therefore ,we recommended initiating diuretic, or adding ACEi/ARB to therapy. Patient reported she had severe "dehydration" and dry mouth that caused her to "shock" while sleeping when taking diuretics in the past and refused diuretic recommendation. Patient has cyst on her kidney that will not need additional therapy at this time. Referral was placed per patient request , for endocrinologist at [facility] hospital to address surgical alternatives for the BP management.  Of notice, her repeat CT shows NO aneurysm. Patient is experiencing episodes of panic attacks and increased anxiety as well, but Peck't discuss alternatives with PCP.   Current HTN meds:  Spironolactone 25mg  daily  Labetalol 100mg  BID (stopped 9/2 ) Nattokinase 2000 units twice daily  Previously tried:  Carvedilol 12.5mg  BID - "doesn't work" after 2 - 3 days therapy Amlodipine - hives Lisinopril - itching and rash HCTZ - dry mouth "shoked while sleeping" cansedartan 32 - mouth ulcers and vision changes Valsartan - lack therapeutic response after 2 doses clonidine 0.1mg  started on 9/2  for x 4 days - tired and unable to "funtion", dizzy, experienced mental meltdown Labetalol - swollen neck  BP goal: <130/80  Family History: heart attach in mother and maternal grandmother, diabetes and hypertension in mother, hyperlipidemia and colon cancer in father.  Social History: denies tobacco use, reports occasional alcohol intake  Diet: avoids added sodium and caffeine , mainly home cooked meals, rich in fruits and vegetables  Exercise: frequent walks  Home BP readings:  15 readings; average 150/92 (range 409 to 811 systolic) (range 77 to 914 diastolic), HR range 78-29 bpm  Home BP cuff (arm cuff Omron) - accurate within 2mmHg from   Wt Readings from Last 3 Encounters:  09/10/19 174 lb (78.9 kg)  07/17/19 174 lb (78.9 kg)  07/09/19 173 lb (78.5 kg)   BP Readings from Last 3 Encounters:  09/10/19 (!) 162/90  08/06/19 128/84  07/17/19 (!) 158/82   Pulse Readings from Last 3 Encounters:  09/10/19 71  08/06/19 72  07/17/19 68    Past Medical History:  Diagnosis Date  . Hypertension   . Lung nodule   . Solitary kidney     Current Outpatient Medications on File Prior to Visit  Medication Sig Dispense Refill  . Cholecalciferol (VITAMIN D3) 1000 units CAPS Take by mouth.    . cyanocobalamin 100 MCG tablet Take 100 mcg daily by mouth.    Marland Kitchen NALTREXONE-BUPROPION HCL ER PO Take 3 mg by mouth. Patient takes daily    . psyllium (METAMUCIL) 58.6 %  powder Take 1 packet by mouth 3 (three) times daily. Take 2 tablespoons mixed with 8oz water    . zinc gluconate 50 MG tablet Take 50 mg by mouth daily. Take 1/2 tablet daily     No current facility-administered medications on file prior to visit.    Allergies  Allergen Reactions  . Cefaclor Diarrhea and Rash  . Cefdinir Diarrhea  . Ciprofloxacin Hives, Nausea And Vomiting and Rash  . Lisinopril Itching and Rash  . Penicillins Hives and Rash    Can take Keflex without problems  . Sulfa Antibiotics Hives and Rash  .  Amlodipine Hives and Rash  . Clindamycin/Lincomycin Hives and Rash  . Cortisone Hives, Other (See Comments) and Swelling    She had hives and was given a cortisone shot. The shot made the hives worse.    Marland Kitchen Hydrochlorothiazide Other (See Comments)    Mouth dry   . Vancomycin Hives and Rash    (ALL MYCINS)   . Candesartan     Mouth ulcers and vision changes  . Corticosteroids Hives  . Flonase [Fluticasone]     swelling  . Labetalol     swelling  . Losartan   . Nystatin   . Tetracyclines & Related   . Azithromycin Hives and Rash  . Erythromycin Rash    Per patient she is allergic to "all myocins"    Blood pressure (!) 162/90, pulse 71, resp. rate 14, height 5\' 4"  (1.626 m), weight 174 lb (78.9 kg), SpO2 98 %.  Essential hypertension BP remains challenging to control as patient experienced adverse side effects to most therapeutic options (please see noted for details). Will STOP labetalol, start hydralazine 25mg  every evening and 25 mg as needed for SBP above 160. Plan to continue spironolactone 25mg  daily and resume losartan 50mg  daily.  Will repeat BMEt today, and in 3 weeks to monitor renal function and potassium levels. We discussed referral for renal denervation, but patient is awaiting visit with endocrinologist at [facility].  Plan to follow up in 4 weeks and continue medication titration until endocrinologist consult completed.   Anxiety Severe anxiety may be influencing blood pressure as well. Patient instructed to discuss options with PCP, but she will like to try a "natural alternative" at this time.   I recommended using orange blossom water and/or GABA supplement to control her anxiety until further assessment with PCP.     Bonnie Peck PharmD, BCPS, Bonnie Peck [id number] 09/15/2019 5:08 PM

## 2019-09-10 NOTE — Progress Notes (Signed)
This encounter was created in error - please disregard.

## 2019-09-10 NOTE — Patient Instructions (Addendum)
Return for a  follow up appointment in 4 weeks  Go to the lab in [place], repeat blood work in 2 weeks  Check your blood pressure at home daily (if able) and keep record of the readings.  Take your BP meds as follows: *START taking hydralazine 25mg  every evening and 25mg  as needed for systolic blood pressure above 160* *Resume losartan 50mg  daily* *STOP taking labetalol* *Continue taking spironolactone 25mg  every evening* *Continue taking nattokinase*  Bring all of your meds, your BP cuff and your record of home blood pressures to your next appointment.  Exercise as you're able, try to walk approximately 30 minutes per day.  Keep salt intake to a minimum, especially watch canned and prepared boxed foods.  Eat more fresh fruits and vegetables and fewer canned items.  Avoid eating in fast food restaurants.    HOW TO TAKE YOUR BLOOD PRESSURE: . Rest 5 minutes before taking your blood pressure. .  Don't smoke or drink caffeinated beverages for at least 30 minutes before. . Take your blood pressure before (not after) you eat. . Sit comfortably with your back supported and both feet on the floor (don't cross your legs). . Elevate your arm to heart level on a table or a desk. . Use the proper sized cuff. It should fit smoothly and snugly around your bare upper arm. There should be enough room to slip a fingertip under the cuff. The bottom edge of the cuff should be 1 inch above the crease of the elbow. . Ideally, take 3 measurements at one sitting and record the average.

## 2019-09-15 DIAGNOSIS — F419 Anxiety disorder, unspecified: Secondary | ICD-10-CM | POA: Insufficient documentation

## 2019-09-15 MED ORDER — HYDRALAZINE HCL 25 MG PO TABS: ORAL_TABLET | ORAL | 1 refills | Status: DC

## 2019-09-15 NOTE — Assessment & Plan Note (Signed)
Severe anxiety may be influencing blood pressure as well. Patient instructed to discuss options with PCP, but she will like to try a "natural alternative" at this time.   I recommended using orange blossom water and/or GABA supplement to control her anxiety until further assessment with PCP.

## 2019-09-15 NOTE — Assessment & Plan Note (Signed)
BP remains challenging to control as patient experienced adverse side effects to most therapeutic options (please see noted for details). Will STOP labetalol, start hydralazine 25mg  every evening and 25 mg as needed for SBP above 160. Plan to continue spironolactone 25mg  daily and resume losartan 50mg  daily.  Will repeat BMEt today, and in 3 weeks to monitor renal function and potassium levels. We discussed referral for renal denervation, but patient is awaiting visit with endocrinologist at [facility].  Plan to follow up in 4 weeks and continue medication titration until endocrinologist consult completed.

## 2019-09-21 NOTE — Progress Notes (Signed)
HPI: FU hypertension. Previously evaluated at [facility] for hypertension that increased following Covid vaccine. 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 Carotid dopplers 5/21 showed no significant stenosis. Echo 6/21 showed normal LV function.  Patient states that since her Covid vaccine in April she has had spikes in her blood pressure as high as 240/120. CTA September 2021 here in [place] showed no thoracic or abdominal aortic aneurysm, congenital absence of the left kidney and minimal atherosclerotic plaque in the abdominal aorta; no renal artery stenosis on the right.  Laboratories August 2021 showed normal TSH, normal metanephrines and normal aldosterone level, low renin level at less than 1.167 with an aldosterone to renin ratio of greater than 95.  Since last seen patient denies dyspnea, chest pain, palpitations or syncope.  She states her blood pressure is running in the 140/90 range.  Current HTN meds:  Spironolactone 25mg  daily  Labetalol 100mg  BID (stopped 9/2 ) Nattokinase 2000 units twice daily  Previously tried:  Carvedilol 12.5mg  BID - "doesn't work" after 2 - 3 days therapy Amlodipine - hives Lisinopril - itching and rash HCTZ - dry mouth "shoked while sleeping" cansedartan 32 - mouth ulcers and vision changes Valsartan - lack therapeutic response after 2 doses clonidine 0.1mg  started on 9/2 for x 4 days - tired and unable to "function", dizzy, experienced mental meltdown Labetalol - swollen neck  Current Outpatient Medications  Medication Sig Dispense Refill  . Cholecalciferol (VITAMIN D3) 1000 units CAPS Take by mouth.    . cyanocobalamin 100 MCG tablet Take 100 mcg daily by mouth.    . losartan (COZAAR) 50 MG tablet Take 1 tablet (50 mg total) by mouth daily. 90 tablet 1  . NALTREXONE-BUPROPION HCL ER PO Take 3 mg by mouth. Patient takes daily    . psyllium (METAMUCIL) 58.6 % powder Take 1 packet by mouth 3 (three) times daily. Take 2 tablespoons mixed with 8oz water     . spironolactone (ALDACTONE) 25 MG tablet Take 1 tablet (25 mg total) by mouth daily. 30 tablet 2  . zinc gluconate 50 MG tablet Take 50 mg by mouth daily. Take 1/2 tablet daily     No current facility-administered medications for this visit.     Past Medical History:  Diagnosis Date  . Hypertension   . Lung nodule   . Solitary kidney     Past Surgical History:  Procedure Laterality Date  . APPENDECTOMY  age 54  . CESAREAN SECTION  1998  . KIDNEY SURGERY Left born   . RHINOPLASTY    . UTERINE SEPTUM RESECTION      Social History   Socioeconomic History  . Marital status: Married    Spouse name: Not on file  . Number of children: 2  . Years of education: Not on file  . Highest education level: Not on file  Occupational History  . Not on file  Tobacco Use  . Smoking status: Never Smoker  . Smokeless tobacco: Never Used  Substance and Sexual Activity  . Alcohol use: Yes  . Drug use: No  . Sexual activity: Yes  Other Topics Concern  . Not on file  Social History Narrative  . Not on file   Social Determinants of Health   Financial Resource Strain:   . Difficulty of Paying Living Expenses: Not on file  Food Insecurity:   . Worried About Charity fundraiser in the Last Year: Not on file  . Ran Out of Food  in the Last Year: Not on file  Transportation Needs:   . Lack of Transportation (Medical): Not on file  . Lack of Transportation (Non-Medical): Not on file  Physical Activity:   . Days of Exercise per Week: Not on file  . Minutes of Exercise per Session: Not on file  Stress:   . Feeling of Stress : Not on file  Social Connections:   . Frequency of Communication with Friends and Family: Not on file  . Frequency of Social Gatherings with Friends and Family: Not on file  . Attends Religious Services: Not on file  . Active Member of Clubs or Organizations: Not on file  . Attends Archivist Meetings: Not on file  . Marital Status: Not on file    Intimate Partner Violence:   . Fear of Current or Ex-Partner: Not on file  . Emotionally Abused: Not on file  . Physically Abused: Not on file  . Sexually Abused: Not on file    Family History  Problem Relation Age of Onset  . Melanoma Maternal Grandmother   . Heart attack Maternal Grandmother   . Heart attack Mother   . Diabetes Mother   . Hypertension Mother   . Hyperlipidemia Father   . Colon cancer Father     ROS: no fevers or chills, productive cough, hemoptysis, dysphasia, odynophagia, melena, hematochezia, dysuria, hematuria, rash, seizure activity, orthopnea, PND, pedal edema, claudication. Remaining systems are negative.  Physical Exam: Well-developed well-nourished in no acute distress.  Skin is warm and dry.  HEENT is normal.  Neck is supple.  Chest is clear to auscultation with normal expansion.  Cardiovascular exam is regular rate and rhythm.  Abdominal exam nontender or distended. No masses palpated. Extremities show no edema. neuro grossly intact  A/P  1 hypertension-patient has not tolerated multiple medications.  She has been referred to [facility] to evaluate possible adrenal adenoma.  Note there was no renal artery stenosis on the right on CTA.  Metanephrines were normal.  Blood pressure remains mildly elevated.  We will continue with present regimen.  She states in general it is in the 140/90 range.  Again multiple medication intolerances.  2 question thoracic aortic aneurysm-she did not have an aneurysmal follow-up CTA here in [place].  3 anxiety-per primary care.  Kirk Ruths, MD

## 2019-09-25 LAB — BASIC METABOLIC PANEL
BUN/Creatinine Ratio: 12 (ref 9–23)
BUN: 11 mg/dL (ref 6–24)
CO2: 24 mmol/L (ref 20–29)
Calcium: 9.8 mg/dL (ref 8.7–10.2)
Chloride: 102 mmol/L (ref 96–106)
Creatinine, Ser: 0.93 mg/dL (ref 0.57–1.00)
GFR calc Af Amer: 82 mL/min/{1.73_m2} (ref >59)
GFR calc non Af Amer: 71 mL/min/{1.73_m2} (ref >59)
Glucose: 107 mg/dL — ABNORMAL HIGH (ref 65–99)
Potassium: 4.2 mmol/L (ref 3.5–5.2)
Sodium: 140 mmol/L (ref 134–144)

## 2019-09-30 ENCOUNTER — Encounter: Payer: Self-pay | Admitting: Cardiology

## 2019-09-30 ENCOUNTER — Other Ambulatory Visit: Payer: Self-pay

## 2019-09-30 ENCOUNTER — Ambulatory Visit (INDEPENDENT_AMBULATORY_CARE_PROVIDER_SITE_OTHER): Payer: 59 | Admitting: Cardiology

## 2019-09-30 VITALS — BP 158/94 | HR 85 | Ht 64.0 in | Wt 175.0 lb

## 2019-09-30 DIAGNOSIS — I712 Thoracic aortic aneurysm, without rupture, unspecified: Secondary | ICD-10-CM

## 2019-09-30 DIAGNOSIS — I10 Essential (primary) hypertension: Secondary | ICD-10-CM

## 2019-09-30 DIAGNOSIS — F419 Anxiety disorder, unspecified: Secondary | ICD-10-CM | POA: Diagnosis not present

## 2019-09-30 NOTE — Patient Instructions (Signed)
  Follow-Up: At [facility], you and your health needs are our priority.  As part of our continuing mission to provide you with exceptional heart care, we have created designated Provider Care Teams.  These Care Teams include your primary Cardiologist (physician) and Advanced Practice Providers (APPs -  Physician Assistants and Nurse Practitioners) who all work together to provide you with the care you need, when you need it.  We recommend signing up for the patient portal called "MyChart".  Sign up information is provided on this After Visit Summary.  MyChart is used to connect with patients for Virtual Visits (Telemedicine).  Patients are able to view lab/test results, encounter notes, upcoming appointments, etc.  Non-urgent messages can be sent to your provider as well.   To learn more about what you can do with MyChart, go to [email or web address].    Your next appointment:   3-4 month(s)  The format for your next appointment:   In Person  Provider:   Kirk Ruths, MD

## 2019-10-05 NOTE — Telephone Encounter (Signed)
Message received from Patient.  Hi Dr Lamonte Sakai, you wanted me to let you know when I had another CT done in order to see the nodule on my lung. There is an updated CT from August in my records now. Let me know if you can view it. Thanks. Bonnie Peck   Message routed to Dr Lamonte Sakai

## 2019-10-06 NOTE — Telephone Encounter (Signed)
Please let her know that I was able to review the CT from 9/3. The small (<4mm) nodule is unchanged. We don't need to do any dedicated follow up, although we will get a look at it and will be able to confirm stability when she gets these CT's for her aorta.

## 2019-10-07 NOTE — Telephone Encounter (Signed)
Dr. Lamonte Sakai, Please see below message from patient.    Hi Dr. Lamonte Sakai, I just wanted to thank you for calling today to make sure all my questions about the lung nodules were answered. You helped tremendously and put my mind at ease for future concerns! I am impressed by your personalism for your patients. You don't see that much these days. Thank you again for being a caring physician and setting things straight so that I no longer have concerns or unanswered questions. I feel much better now! :) Bonnie Peck

## 2019-10-07 NOTE — Telephone Encounter (Signed)
Spoke with her and reviewed her CT chest/ her two punctate nodules are stable - I believe I can see both on CT going back to 06/2017. She does not need anymore CT's to follow these nodules.

## 2019-10-09 NOTE — Telephone Encounter (Signed)
Thanks very much. Glad to do it. Stay well.

## 2019-10-13 ENCOUNTER — Other Ambulatory Visit: Payer: Self-pay

## 2019-10-13 ENCOUNTER — Ambulatory Visit (INDEPENDENT_AMBULATORY_CARE_PROVIDER_SITE_OTHER): Payer: Managed Care, Other (non HMO) | Admitting: Pharmacist

## 2019-10-13 VITALS — BP 150/104 | HR 74 | Resp 16 | Ht 64.0 in | Wt 178.4 lb

## 2019-10-13 DIAGNOSIS — I10 Essential (primary) hypertension: Secondary | ICD-10-CM | POA: Diagnosis not present

## 2019-10-13 MED ORDER — EPLERENONE 25 MG PO TABS: 25.0000 mg | ORAL_TABLET | Freq: Every day | ORAL | 1 refills | Status: DC

## 2019-10-13 NOTE — Assessment & Plan Note (Signed)
Blood pressure remains above goal but greatly improved since initial office. Patient repost decreased stress levels, but not following low sodium diet for over 2 weeks. Also reports ADR to spironolactone .  Will discontinue spironolactone and start eplerenone 25mg  daily. Patient is to repeat BMET in 2 week to re-assess renal function and electrolytes. Instructed to resume low sodium diet, and follow up as needed after visit with specialist at [facility].

## 2019-10-13 NOTE — Patient Instructions (Addendum)
Return for a  follow up appointment AS NEEDED  Go to the lab in 2 weeks  Check your blood pressure at home daily (if able) and keep record of the readings.  Take your BP meds as follows: *STOP taking spironolactone* *START taking eplerenone 25mg  daily*  Bring all of your meds, your BP cuff and your record of home blood pressures to your next appointment.  Exercise as you're able, try to walk approximately 30 minutes per day.  Keep salt intake to a minimum, especially watch canned and prepared boxed foods.  Eat more fresh fruits and vegetables and fewer canned items.  Avoid eating in fast food restaurants.    HOW TO TAKE YOUR BLOOD PRESSURE: . Rest 5 minutes before taking your blood pressure. .  Don't smoke or drink caffeinated beverages for at least 30 minutes before. . Take your blood pressure before (not after) you eat. . Sit comfortably with your back supported and both feet on the floor (don't cross your legs). . Elevate your arm to heart level on a table or a desk. . Use the proper sized cuff. It should fit smoothly and snugly around your bare upper arm. There should be enough room to slip a fingertip under the cuff. The bottom edge of the cuff should be 1 inch above the crease of the elbow. . Ideally, take 3 measurements at one sitting and record the average.

## 2019-10-13 NOTE — Progress Notes (Signed)
Patient ID: SAMHITA KRETSCH                 DOB: 1965/10/14                      MRN: [id number]     HPI: Bonnie Peck is a 54 y.o. female referred by Dr. Stanford Breed to HTN clinic. PMH includes hypertension, left kidney absent, multiple drug allergies, TAA, chronic neck pain, and lung nodule. Patient presents for HTN follow up. Noted adverse reaction to multiple medication (see below for details). Bonnie Peck added nittokinase to her therapy an denies any problems with spironolactone 25mg  or natural compound.  Her blood work revealed an increased renin/aldosterone ratio, but not overproduction of either component;  therefore ,we recommended initiating diuretic, or adding ACEi/ARB to therapy. Patient reported she had severe "dehydration" and dry mouth that caused her to "shock" while sleeping when taking diuretics and refused re-challenge. Patient has cyst on her kidney that will not need additional therapy at this time.her appointment with endocrinologist at [facility] hospital is was scheduled for Dec/03/2019.  Of notice, her repeat CT shows NO aneurysm and Dr Lamonte Sakai (pulmonologist) r/o need for additional intervention related to "punctate nodules", as the where stable and presents in CT imagine since 2019.  Patient is experiencing episodes of panic attacks and increased anxiety as well, but haven't discuss alternatives with PCP.  Patient admits dietary indiscretions for the past few week, and 5lb weight gain. She also reports increased breast tenderness and menopausal symptoms with spironolactone.   Current HTN meds:  Spironolactone 25mg  daily - developed tenderness in breast  Nattokinase 2000 units twice daily Losartan 50mg  daily  Previously tried:  Carvedilol 12.5mg  BID - "doesn't work" after 2 - 3 days therapy Amlodipine - hives Lisinopril - itching and rash HCTZ - dry mouth "shoked while sleeping" cansedartan 32mg  - mouth ulcers and vision changes Valsartan - lack therapeutic response after 2  doses clonidine 0.1mg  started on 9/2 for x 4 days - tired and unable to "funtion", dizzy, experienced mental meltdown Labetalol - swollen neck  BP goal: <130/80  Family History: heart attach in mother and maternal grandmother, diabetes and hypertension in mother, hyperlipidemia and colon cancer in father.  Social History: denies tobacco use, reports occasional alcohol intake  Diet: avoids added sodium and caffeine , mainly home cooked meals, rich in fruits and vegetables  Exercise: frequent walks  Home BP readings:  12 readings: average 145/96  4 afternoon readings: 134/89 Home BP cuff (arm cuff Omron) - accurate within 36mmHg from   Wt Readings from Last 3 Encounters:  10/13/19 178 lb 6.4 oz (80.9 kg)  09/30/19 175 lb (79.4 kg)  09/10/19 174 lb (78.9 kg)   BP Readings from Last 3 Encounters:  10/13/19 (!) 150/104  09/30/19 (!) 158/94  09/10/19 (!) 162/90   Pulse Readings from Last 3 Encounters:  10/13/19 74  09/30/19 85  09/10/19 71    Past Medical History:  Diagnosis Date  . Hypertension   . Lung nodule   . Solitary kidney     Current Outpatient Medications on File Prior to Visit  Medication Sig Dispense Refill  . Cholecalciferol (VITAMIN D3) 1000 units CAPS Take by mouth.    . cyanocobalamin 100 MCG tablet Take 100 mcg daily by mouth.    . gabapentin (NEURONTIN) 100 MG capsule Take 100 mg by mouth 3 (three) times daily.    Marland Kitchen losartan (COZAAR) 50 MG tablet Take 1 tablet (  50 mg total) by mouth daily. 90 tablet 1  . NALTREXONE-BUPROPION HCL ER PO Take 3 mg by mouth. Patient takes daily    . psyllium (METAMUCIL) 58.6 % powder Take 1 packet by mouth 3 (three) times daily. Take 2 tablespoons mixed with 8oz water    . Quercetin 50 MG TABS Take by mouth.    . zinc gluconate 50 MG tablet Take 50 mg by mouth daily. Take 1/2 tablet daily     No current facility-administered medications on file prior to visit.    Allergies  Allergen Reactions  . Cefaclor Diarrhea and  Rash  . Cefdinir Diarrhea  . Ciprofloxacin Hives, Nausea And Vomiting and Rash  . Lisinopril Itching and Rash  . Penicillins Hives and Rash    Can take Keflex without problems  . Sulfa Antibiotics Hives and Rash  . Amlodipine Hives and Rash  . Clindamycin/Lincomycin Hives and Rash  . Cortisone Hives, Other (See Comments) and Swelling    She had hives and was given a cortisone shot. The shot made the hives worse.    Marland Kitchen Hydrochlorothiazide Other (See Comments)    Mouth dry   . Vancomycin Hives and Rash    (ALL MYCINS)   . Candesartan     Mouth ulcers and vision changes  . Corticosteroids Hives  . Flonase [Fluticasone]     swelling  . Labetalol     swelling  . Losartan   . Nystatin   . Tetracyclines & Related   . Azithromycin Hives and Rash  . Erythromycin Rash    Per patient she is allergic to "all myocins"    Blood pressure (!) 150/104, pulse 74, resp. rate 16, height 5\' 4"  (1.626 m), weight 178 lb 6.4 oz (80.9 kg), SpO2 98 %.  Essential hypertension Blood pressure remains above goal but greatly improved since initial office. Patient repost decreased stress levels, but not following low sodium diet for over 2 weeks. Also reports ADR to spironolactone .  Will discontinue spironolactone and start eplerenone 25mg  daily. Patient is to repeat BMET in 2 week to re-assess renal function and electrolytes. Instructed to resume low sodium diet, and follow up as needed after visit with specialist at [facility] [place] [address] [place] [address] 10/13/2019 8:48 AM

## 2019-10-21 ENCOUNTER — Encounter: Payer: Self-pay | Admitting: Pharmacist

## 2019-11-04 LAB — BASIC METABOLIC PANEL
BUN/Creatinine Ratio: 19 (ref 9–23)
BUN: 15 mg/dL (ref 6–24)
CO2: 22 mmol/L (ref 20–29)
Calcium: 9.5 mg/dL (ref 8.7–10.2)
Chloride: 105 mmol/L (ref 96–106)
Creatinine, Ser: 0.77 mg/dL (ref 0.57–1.00)
GFR calc Af Amer: 103 mL/min/{1.73_m2} (ref >59)
GFR calc non Af Amer: 89 mL/min/{1.73_m2} (ref >59)
Glucose: 89 mg/dL (ref 65–99)
Potassium: 4.3 mmol/L (ref 3.5–5.2)
Sodium: 141 mmol/L (ref 134–144)

## 2019-11-09 ENCOUNTER — Emergency Department (INDEPENDENT_AMBULATORY_CARE_PROVIDER_SITE_OTHER): Payer: Managed Care, Other (non HMO)

## 2019-11-09 ENCOUNTER — Encounter: Payer: Self-pay | Admitting: *Deleted

## 2019-11-09 ENCOUNTER — Other Ambulatory Visit: Payer: Self-pay

## 2019-11-09 ENCOUNTER — Emergency Department
Admission: EM | Admit: 2019-11-09 | Discharge: 2019-11-09 | Disposition: A | Discharge status: Home / Self Care | Payer: 59 | Source: Home / Self Care

## 2019-11-09 DIAGNOSIS — S0992XA Unspecified injury of nose, initial encounter: Secondary | ICD-10-CM

## 2019-11-09 DIAGNOSIS — J3489 Other specified disorders of nose and nasal sinuses: Secondary | ICD-10-CM | POA: Diagnosis not present

## 2019-11-09 DIAGNOSIS — I10 Essential (primary) hypertension: Secondary | ICD-10-CM

## 2019-11-09 NOTE — ED Triage Notes (Signed)
Patient presents to Urgent Care with complaints of accidentally hitting herself in the nose since yesterday. Patient reports she was pulling her shirt sleeve up and her hand slipped and she punched herself hard in the nose. Some blood clots today when she blows her nose. No abnormalities noted at this time.

## 2019-11-09 NOTE — Discharge Instructions (Addendum)
Apply warm compresses, take ibuprofen or naproxen as needed for pain.  You have no nasal or facial fracture.

## 2019-11-09 NOTE — ED Provider Notes (Signed)
[facility]    CSN: [id number] Arrival date & time: 11/09/19  4315      History   Chief Complaint Chief Complaint  Patient presents with  . Facial Injury    Nose    HPI Bonnie Peck is a 54 y.o. female.   HPI  Patient presents today with a suspected nose injury after inadvertently punching herself in the nose. She grew concerned as today she has noticed some epistaxis from her right nare when blowing her nose.  She also noted some slight darkening below her eyes.  She denies any difficulty breathing through her nose and has not noted any deformity of the structure of the nose.  Unrelated patient's blood pressure is 190/117 today.  Patient is closely followed by heart care and has recently been diagnosed with hyperaldosteronism and is recently been referred over to [facility].  She reports compliance with all medication regimen and has a follow-up scheduled with Dr. Stanford Breed in the coming week.  She is asymptomatic of any cardiac symptoms.  Past Medical History:  Diagnosis Date  . Hypertension   . Lung nodule   . Solitary kidney     Patient Active Problem List   Diagnosis Date Noted  . Anxiety 09/15/2019  . Solitary pulmonary nodule 07/17/2019  . Multiple drug allergies 07/09/2019  . History of urticaria 07/09/2019  . Immunization reaction 07/09/2019  . Calculus of gallbladder without cholecystitis without obstruction 11/21/2016  . Globus sensation 11/21/2016  . Prepatellar bursitis of right knee 08/16/2015  . Hematuria 04/19/2015  . Essential hypertension 03/11/2015  . 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 APPENDECTOMY  age 16  . CESAREAN SECTION  1998  . KIDNEY SURGERY Left born   . RHINOPLASTY    . UTERINE SEPTUM RESECTION      OB History   No obstetric history on file.    Obstetric Comments  First menstrual 11         Home Medications    Prior to Admission medications     Medication Sig Start Date End Date Taking? Authorizing Provider  Cholecalciferol (VITAMIN D3) 1000 units CAPS Take by mouth.    [provider]  cyanocobalamin 100 MCG tablet Take 100 mcg daily by mouth.    [provider]  eplerenone (INSPRA) 25 MG tablet Take 1 tablet (25 mg total) by mouth daily. To replaced spironolactone 10/13/19   Lelon Perla, MD  gabapentin (NEURONTIN) 100 MG capsule Take 100 mg by mouth 3 (three) times daily.    [provider]  losartan (COZAAR) 50 MG tablet Take 1 tablet (50 mg total) by mouth daily. 09/10/19   Lelon Perla, MD  NALTREXONE-BUPROPION HCL ER PO Take 3 mg by mouth. Patient takes daily    [provider]  psyllium (METAMUCIL) 58.6 % powder Take 1 packet by mouth 3 (three) times daily. Take 2 tablespoons mixed with 8oz water    [provider]  Quercetin 50 MG TABS Take by mouth.    [provider]  zinc gluconate 50 MG tablet Take 50 mg by mouth daily. Take 1/2 tablet daily    [provider]    Family History Family History  Problem Relation Age of Onset  . Melanoma Maternal Grandmother   . Heart attack Maternal Grandmother   . Heart attack Mother   . Diabetes Mother   . Hypertension Mother   . Hyperlipidemia Father   .  Colon cancer Father     Social History Social History   Tobacco Use  . Smoking status: Never Smoker  . Smokeless tobacco: Never Used  Substance Use Topics  . Alcohol use: Yes    Comment: rare  . Drug use: No     Allergies   Cefaclor, Cefdinir, Ciprofloxacin, Lisinopril, Penicillins, Sulfa antibiotics, Amlodipine, Clindamycin/lincomycin, Cortisone, Hydrochlorothiazide, Vancomycin, Candesartan, Corticosteroids, Flonase [fluticasone], Labetalol, Losartan, Nystatin, Tetracyclines & related, Azithromycin, and Erythromycin  Review of Systems Review of Systems Pertinent negatives listed in HPI  Physical Exam Triage Vital Signs ED Triage Vitals  Enc  Vitals Group     BP 11/09/19 0951 (!) 190/117     Pulse Rate 11/09/19 0951 68     Resp 11/09/19 0951 16     Temp 11/09/19 0951 98.8 F (37.1 C)     Temp Source 11/09/19 0951 Oral     SpO2 11/09/19 0951 98 %     Weight --      Height --      Head Circumference --      Peak Flow --      Pain Score 11/09/19 0949 7     Pain Loc --      Pain Edu? --      Excl. in [place]? --    No data found.  Updated Vital Signs BP (!) 190/117 (BP Location: Left Arm) Comment: Is following up with [facility] for RAAS eval and treatment  Pulse 68   Temp 98.8 F (37.1 C) (Oral)   Resp 16   LMP  (LMP Unknown)   SpO2 98%   Visual Acuity Right Eye Distance:   Left Eye Distance:   Bilateral Distance:    Right Eye Near:   Left Eye Near:    Bilateral Near:     Physical Exam Vitals reviewed.  Constitutional:      Appearance: Normal appearance.  HENT:     Head: Normocephalic.     Nose: Congestion present.  Cardiovascular:     Rate and Rhythm: Normal rate and regular rhythm.  Pulmonary:     Effort: Pulmonary effort is normal.     Breath sounds: Normal breath sounds.  Musculoskeletal:     Cervical back: Normal range of motion.  Skin:    Capillary Refill: Capillary refill takes less than 2 seconds.  Neurological:     General: No focal deficit present.     Mental Status: She is alert and oriented to person, place, and time.  Psychiatric:        Mood and Affect: Mood normal.        Behavior: Behavior normal.        Thought Content: Thought content normal.        Judgment: Judgment normal.      UC Treatments / Results  Labs (all labs ordered are listed, but only abnormal results are displayed) Labs Reviewed - No data to display  EKG   Radiology No results found.  Procedures Procedures (including critical care time)  Medications Ordered in UC Medications - No data to display  Initial Impression / Assessment and Plan / UC Course  I have reviewed the triage vital signs and the nursing  notes.  Pertinent labs & imaging results that were available during my care of the patient were reviewed by me and considered in my medical decision making (see chart for details).    Facial series negative for any acute nasal fracture.  Patient advised to apply warm compresses and  take ibuprofen or naproxen as needed for pain.  Patient blood pressure is elevated x2 readings today however this is chronic in grossly documented throughout her chart.  Patient has no acute worrisome cardiac symptoms today.  Advised to continue follow-up with cardiologist as needed. Final Clinical Impressions(s) / UC Diagnoses   Final diagnoses:  Injury of nose, initial encounter     Discharge Instructions     Apply warm compresses, take ibuprofen or naproxen as needed for pain.  You have no nasal or facial fracture.    ED Prescriptions    None     PDMP not reviewed this encounter.   Scot Jun, FNP 11/09/19 1048

## 2019-11-29 ENCOUNTER — Other Ambulatory Visit: Payer: Self-pay | Admitting: Cardiology

## 2019-12-04 ENCOUNTER — Other Ambulatory Visit: Payer: Self-pay | Admitting: Pharmacist Clinician (PhC)/ Clinical Pharmacy Specialist

## 2019-12-04 MED ORDER — EPLERENONE 25 MG PO TABS
ORAL_TABLET | ORAL | 1 refills | Status: DC
Start: 2019-12-04 — End: 2020-05-23

## 2019-12-04 MED ORDER — LOSARTAN POTASSIUM 50 MG PO TABS
50.0000 mg | ORAL_TABLET | Freq: Every day | ORAL | 1 refills | Status: DC
Start: 2019-12-04 — End: 2020-05-23

## 2020-01-06 ENCOUNTER — Ambulatory Visit: Payer: 59 | Admitting: Cardiology

## 2020-01-08 ENCOUNTER — Other Ambulatory Visit: Payer: Self-pay | Admitting: Pharmacist

## 2020-01-08 MED ORDER — HYDRALAZINE HCL 25 MG PO TABS: ORAL_TABLET | ORAL | 0 refills | Status: DC

## 2020-01-12 ENCOUNTER — Encounter: Payer: Self-pay | Admitting: Allergy

## 2020-01-13 ENCOUNTER — Other Ambulatory Visit: Payer: Self-pay

## 2020-01-13 ENCOUNTER — Emergency Department (INDEPENDENT_AMBULATORY_CARE_PROVIDER_SITE_OTHER)
Admission: EM | Admit: 2020-01-13 | Discharge: 2020-01-13 | Disposition: A | Discharge status: Home / Self Care | Payer: Managed Care, Other (non HMO) | Source: Home / Self Care

## 2020-01-13 ENCOUNTER — Encounter: Payer: Self-pay | Admitting: *Deleted

## 2020-01-13 DIAGNOSIS — H579 Unspecified disorder of eye and adnexa: Secondary | ICD-10-CM | POA: Diagnosis not present

## 2020-01-13 DIAGNOSIS — H04129 Dry eye syndrome of unspecified lacrimal gland: Secondary | ICD-10-CM

## 2020-01-13 NOTE — ED Provider Notes (Signed)
[facility]    CSN: [id number] Arrival date & time: 01/13/20  0803      History   Chief Complaint Chief Complaint  Patient presents with  . Allergic Reaction    HPI Bonnie Peck is a 55 y.o. female.   HPI  Patient with a long history of recurrent urticaria, anxiety, hyperaldosteronism induced accelerated hypertension and multiple drug allergies. Patient recently seen by allergist on 01/04/20 for management of recurrent urticaria and during that visit, discussed concern for blurring of vision and eye irritation. Allergist recommended follow-up with opthalmology for   further evaluation of eye symptoms given patient has a history of accelerated hypertension due to hyper aldosteronism which could be causing her symptoms.  She presents today with a concern of dryness in her pressure and irritation.  She reports that she tried to obtain a appointment with her eye doctor and they are booked out for several months.  Past Medical History:  Diagnosis Date  . Hypertension   . Lung nodule   . Solitary kidney     Patient Active Problem List   Diagnosis Date Noted  . Anxiety 09/15/2019  . Solitary pulmonary nodule 07/17/2019  . Multiple drug allergies 07/09/2019  . History of urticaria 07/09/2019  . Immunization reaction 07/09/2019  . Calculus of gallbladder without cholecystitis without obstruction 11/21/2016  . Globus sensation 11/21/2016  . Prepatellar bursitis of right knee 08/16/2015  . Hematuria 04/19/2015  . Essential hypertension 03/11/2015  . 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 APPENDECTOMY  age 67  . CESAREAN SECTION  1998  . CHOLECYSTECTOMY    . KIDNEY SURGERY Left born   . RHINOPLASTY    . UTERINE SEPTUM RESECTION      OB History   No obstetric history on file.    Obstetric Comments  First menstrual 11         Home Medications    Prior to Admission medications   Medication Sig  Start Date End Date Taking? Authorizing Provider  Cholecalciferol (VITAMIN D3) 1000 units CAPS Take by mouth.    [provider]  cyanocobalamin 100 MCG tablet Take 100 mcg daily by mouth.    [provider]  eplerenone (INSPRA) 25 MG tablet Take 1 tablet (25 mg total) by mouth daily. To replace spironolactone 12/04/19   Lelon Perla, MD  gabapentin (NEURONTIN) 100 MG capsule Take 100 mg by mouth 3 (three) times daily.    [provider]  hydrALAZINE (APRESOLINE) 25 MG tablet Take as needed for blood pressure above 160. May take up to 4 doses per day. 01/08/20   Lelon Perla, MD  losartan (COZAAR) 50 MG tablet Take 1 tablet (50 mg total) by mouth daily. 12/04/19   Lelon Perla, MD  NALTREXONE-BUPROPION HCL ER PO Take 3 mg by mouth. Patient takes daily    [provider]  psyllium (METAMUCIL) 58.6 % powder Take 1 packet by mouth 3 (three) times daily. Take 2 tablespoons mixed with 8oz water    [provider]  Quercetin 50 MG TABS Take by mouth.    [provider]  zinc gluconate 50 MG tablet Take 50 mg by mouth daily. Take 1/2 tablet daily    [provider]    Family History Family History  Problem Relation Age of Onset  . Melanoma Maternal Grandmother   . Heart attack Maternal Grandmother   . Heart attack Mother   .  Diabetes Mother   . Hypertension Mother   . Hyperlipidemia Father   . Colon cancer Father     Social History Social History   Tobacco Use  . Smoking status: Never Smoker  . Smokeless tobacco: Never Used  Vaping Use  . Vaping Use: Never used  Substance Use Topics  . Alcohol use: Yes    Comment: rare  . Drug use: No     Allergies   Cefaclor, Cefdinir, Ciprofloxacin, Lisinopril, Penicillins, Sulfa antibiotics, Amlodipine, Clindamycin/lincomycin, Cortisone, Hydrochlorothiazide, Vancomycin, Candesartan, Corticosteroids, Flonase [fluticasone], Labetalol, Losartan, Macrobid [nitrofurantoin],  Nystatin, Tetracyclines & related, Wound dressing adhesive, Azithromycin, and Erythromycin Review of Systems Review of Systems Pertinent negatives listed in HPI  Physical Exam Triage Vital Signs ED Triage Vitals  Enc Vitals Group     BP 01/13/20 0814 (!) 195/130     Pulse Rate 01/13/20 0814 87     Resp 01/13/20 0814 18     Temp 01/13/20 0814 98.5 F (36.9 C)     Temp Source 01/13/20 0814 Oral     SpO2 01/13/20 0814 100 %     Weight 01/13/20 0817 171 lb (77.6 kg)     Height 01/13/20 0817 5\' 4"  (1.626 m)     Head Circumference --      Peak Flow --      Pain Score 01/13/20 0817 0     Pain Loc --      Pain Edu? --      Excl. in [place]? --    No data found.  Updated Vital Signs BP (!) 195/130 (BP Location: Right Arm)   Pulse 87   Temp 98.5 F (36.9 C) (Oral)   Resp 18   Ht 5\' 4"  (1.626 m)   Wt 171 lb (77.6 kg)   LMP  (LMP Unknown)   SpO2 100%   BMI 29.35 kg/m   Visual Acuity Right Eye Distance:   Left Eye Distance:   Bilateral Distance:    Right Eye Near:   Left Eye Near:    Bilateral Near:     Physical Exam Constitutional:      Appearance: Normal appearance.  HENT:     Head: Normocephalic.  Eyes:     General:        Right eye: No discharge.        Left eye: No discharge.     Extraocular Movements: Extraocular movements intact.     Conjunctiva/sclera: Conjunctivae normal.     Pupils: Pupils are equal, round, and reactive to light.  Cardiovascular:     Rate and Rhythm: Normal rate and regular rhythm.  Pulmonary:     Effort: Pulmonary effort is normal.     Breath sounds: Normal breath sounds.  Skin:    Capillary Refill: Capillary refill takes less than 2 seconds.  Neurological:     General: No focal deficit present.     Mental Status: She is alert and oriented to person, place, and time.  Psychiatric:        Mood and Affect: Mood normal.      UC Treatments / Results  Labs (all labs ordered are listed, but only abnormal results are displayed) Labs  Reviewed - No data to display  EKG   Radiology No results found.  Procedures Procedures (including critical care time)  Medications Ordered in UC Medications - No data to display  Initial Impression / Assessment and Plan / UC Course  I have reviewed the triage vital signs and the nursing notes.  Pertinent labs & imaging results that were available during my care of the patient were reviewed by me and considered in my medical decision making (see chart for details).      Exam of eyes shows no visual evidence of infection, eye redness, or eye puffiness.  Given symptoms patient is encouraged to follow-up with ophthalmologist as I am also concerned that symptoms may be related to longstanding history of accelerated hypertension can cause some hypertensive eye disease.  Patient given information to follow-up with [facility] eye surgeons as they manage patient's for acute eye conditions.  For Drive management recommended an over-the-counter eye lubricant for management of that symptom alone.  Patient verbalized understanding and agreement with plan. Final Clinical Impressions(s) / UC Diagnoses   Final diagnoses:  Eye problems  Eye dryness  Eye pressure     Discharge Instructions     [facility] [address] [place],  [place], [place] [address] [phone] Please contact their office and schedule an evaluation. In the mean time, purchase over the counter eye lubricant to manage dry eyes.      ED Prescriptions    None     PDMP not reviewed this encounter.   Scot Jun, FNP 01/13/20 1053

## 2020-01-13 NOTE — ED Triage Notes (Addendum)
Pt reports taking 2 Macrobid pills 3wks ago and his since felt like her eyes are"gritty" and blurry. Using several eyes gtts and PCP gave her a prednisone shot last wk without relief. Pt reports that she is seeing multiple doctors about her BP. BP @ home per pt runs 140/90.

## 2020-01-13 NOTE — Discharge Instructions (Addendum)
[facility] [place],  [place], [place] [address] [phone] Please contact their office and schedule an evaluation. In the mean time, purchase over the counter eye lubricant to manage dry eyes.

## 2020-01-27 ENCOUNTER — Other Ambulatory Visit: Payer: Self-pay

## 2020-01-27 ENCOUNTER — Encounter: Payer: Self-pay | Admitting: Cardiology

## 2020-01-27 ENCOUNTER — Ambulatory Visit (INDEPENDENT_AMBULATORY_CARE_PROVIDER_SITE_OTHER): Payer: Managed Care, Other (non HMO) | Admitting: Cardiology

## 2020-01-27 VITALS — BP 142/80 | HR 92 | Ht 64.0 in | Wt 175.8 lb

## 2020-01-27 DIAGNOSIS — I712 Thoracic aortic aneurysm, without rupture, unspecified: Secondary | ICD-10-CM

## 2020-01-27 DIAGNOSIS — I10 Essential (primary) hypertension: Secondary | ICD-10-CM | POA: Diagnosis not present

## 2020-01-27 DIAGNOSIS — R072 Precordial pain: Secondary | ICD-10-CM | POA: Diagnosis not present

## 2020-01-27 NOTE — Patient Instructions (Signed)

## 2020-01-27 NOTE — Progress Notes (Signed)
HPI: FU hypertension. Previously evaluated at [facility] for hypertension that increased following Covid vaccine. 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 Carotid dopplers 5/21 showed no significant stenosis. Echo 6/21 showed normal LV function.Patient states that since her Covid vaccine in April she has had spikes in her blood pressure as high as 240/120. CTA September 2021 here in [place] showed no thoracic or abdominal aortic aneurysm, congenital absence of the left kidney and minimal atherosclerotic plaque in the abdominal aorta; no renal artery stenosis on the right.  Laboratories August 2021 showed normal TSH, normal metanephrines and normal aldosterone level, low renin level at less than 1.167 with an aldosterone to renin ratio of greater than 95.   Patient contacted the office yesterday with chest pain and presented to my schedule today.  Had cholecystectomy December 2021.  Since last seen over the past 1 week she notes pain in her left breast area that has been continuous.  It increases with moving her head.  It is not pleuritic, positional.  No associated symptoms.  She denies dyspnea on exertion or syncope.  She is concerned about a blood clot given her recent surgery.  Current Outpatient Medications  Medication Sig Dispense Refill  . BLACK CURRANT SEED OIL PO Take by mouth.    . Cholecalciferol (VITAMIN D3) 1000 units CAPS Take by mouth.    . cyanocobalamin 100 MCG tablet Take 100 mcg daily by mouth.    Marland Kitchen eplerenone (INSPRA) 25 MG tablet Take 1 tablet (25 mg total) by mouth daily. To replace spironolactone 90 tablet 1  . hydrALAZINE (APRESOLINE) 25 MG tablet Take as needed for blood pressure above 160. May take up to 4 doses per day. 120 tablet 0  . losartan (COZAAR) 50 MG tablet Take 1 tablet (50 mg total) by mouth daily. 90 tablet 1  . milk thistle 175 MG tablet Take 175 mg by mouth daily.    Marland Kitchen NALTREXONE-BUPROPION HCL ER PO Take 3 mg by mouth. Patient takes daily    . Omega-3 Fatty  Acids (FISH OIL PO) Take by mouth. With flax seed    . psyllium (METAMUCIL) 58.6 % powder Take 1 packet by mouth 3 (three) times daily. Take 2 tablespoons mixed with 8oz water    . Quercetin 50 MG TABS Take by mouth.    . Turmeric (CURCUMIN 95 PO) Take by mouth.    . zinc gluconate 50 MG tablet Take 50 mg by mouth daily. Take 1/2 tablet daily     No current facility-administered medications for this visit.     Past Medical History:  Diagnosis Date  . Hypertension   . Lung nodule   . Solitary kidney     Past Surgical History:  Procedure Laterality Date  . APPENDECTOMY  age 83  . CESAREAN SECTION  1998  . CHOLECYSTECTOMY    . KIDNEY SURGERY Left born   . RHINOPLASTY    . UTERINE SEPTUM RESECTION      Social History   Socioeconomic History  . Marital status: Married    Spouse name: Not on file  . Number of children: 2  . Years of education: Not on file  . Highest education level: Not on file  Occupational History  . Not on file  Tobacco Use  . Smoking status: Never Smoker  . Smokeless tobacco: Never Used  Vaping Use  . Vaping Use: Never used  Substance and Sexual Activity  . Alcohol use: Yes    Comment:  rare  . Drug use: No  . Sexual activity: Yes  Other Topics Concern  . Not on file  Social History Narrative  . Not on file   Social Determinants of Health   Financial Resource Strain: Not on file  Food Insecurity: Not on file  Transportation Needs: Not on file  Physical Activity: Not on file  Stress: Not on file  Social Connections: Not on file  Intimate Partner Violence: Not on file    Family History  Problem Relation Age of Onset  . Melanoma Maternal Grandmother   . Heart attack Maternal Grandmother   . Heart attack Mother   . Diabetes Mother   . Hypertension Mother   . Hyperlipidemia Father   . Colon cancer Father     ROS: no fevers or chills, productive cough, hemoptysis, dysphasia, odynophagia, melena, hematochezia, dysuria, hematuria, rash,  seizure activity, orthopnea, PND, pedal edema, claudication. Remaining systems are negative.  Physical Exam: Well-developed well-nourished in no acute distress.  Skin is warm and dry.  HEENT is normal.  Neck is supple.  Chest is clear to auscultation with normal expansion.  Cardiovascular exam is regular rate and rhythm.  Abdominal exam nontender or distended. No masses palpated. Extremities show no edema. neuro grossly intact  ECG-normal sinus rhythm at a rate of 92, no ST changes.  Personally reviewed  A/P  1 chest pain-symptoms seem most consistent with musculoskeletal pain.  However she is concerned about the possibility of clotting.  We will arrange a D-dimer.  If normal no further evaluation.  If abnormal will plan CTA to rule out pulmonary embolus.  2 hypertension-as outlined in previous notes patient has not tolerated multiple medications.  Previous metanephrines were normal and previous CTA showed no renal artery stenosis on the right.  She is awaiting evaluation at [facility] for possible adrenal adenoma.  Continue present medications.  3 question history of thoracic aortic aneurysm-follow-up CTA in [place] did not show aneurysm.  4 anxiety-per primary care  Kirk Ruths, MD

## 2020-01-28 LAB — D-DIMER, QUANTITATIVE: D-DIMER: <0.2 mg/L FEU (ref 0.00–0.49)

## 2020-02-02 ENCOUNTER — Other Ambulatory Visit: Payer: Self-pay

## 2020-02-02 ENCOUNTER — Encounter: Payer: Self-pay | Admitting: Allergy

## 2020-02-02 ENCOUNTER — Ambulatory Visit (INDEPENDENT_AMBULATORY_CARE_PROVIDER_SITE_OTHER): Payer: Managed Care, Other (non HMO) | Admitting: Allergy

## 2020-02-02 VITALS — BP 158/70 | HR 98 | Temp 97.1°F | Resp 18 | Ht 65.39 in | Wt 178.0 lb

## 2020-02-02 DIAGNOSIS — H9313 Tinnitus, bilateral: Secondary | ICD-10-CM

## 2020-02-02 DIAGNOSIS — T7840XD Allergy, unspecified, subsequent encounter: Secondary | ICD-10-CM

## 2020-02-02 DIAGNOSIS — Z889 Allergy status to unspecified drugs, medicaments and biological substances status: Secondary | ICD-10-CM

## 2020-02-02 NOTE — Assessment & Plan Note (Signed)
Patient has been having issues with developing rash/hives after multiple different antibiotics use and seems to be getting worse over the years. Usually takes 2 days to appear but the last time it appeared on day 1. Takes antihistamines with some benefit. Systemic steroids do not seem to help. Otherwise patient denies having issues with breaking out in rash/hives without antibiotics on board. 2021 bloodwork (CBC diff, ANA, CU, tryptase, ESR) all normal.   Discussed at length that when patients have a long list of drug allergies, there is a question of whether the infections/stress itself on the body causes the rash and not necessarily the antibiotics OR if there's some type of inactive ingredient/filler in these medications causing the symptoms.  Get bloodwork as below.   Continue to avoid drugs on medication allergy list.   Take pictures and keep track of episodes.  Take pictures of the drug ingredient list.  Consider penicillin skin testing/drug challenge in the future.  Patient is hesitant about undergoing drug challenges and would like to wait until her dry eye issues resolved.

## 2020-02-02 NOTE — Progress Notes (Signed)
Follow Up Note  RE: Bonnie Peck MRN: [id number] DOB: September 11, 1965 Date of Office Visit: 02/02/2020  Referring provider: Zara Chess, NP Primary care provider: Zara Chess, NP  Chief Complaint: Urticaria (Break out in hives from certain antibiotics.)  History of Present Illness: I had the pleasure of seeing Bonnie Peck for a follow up visit at [facility] on 02/02/2020. She is a 55 y.o. female, who is being followed for adverse vaccine reaction. Her previous allergy office visit was on 07/09/2019 with Dr. Maudie Mercury. Today is a new complaint visit of antibiotic reactions.  Patient tends to break out in hives after taking antibiotics. This has been going on for many years and seems to be worsening. Usually takes about 2 days to appear. This can occur anywhere on her body and describes it as raised, red and pruritic. She usually takes benadryl for this but the last time it took about 7 days to resolve.   No ecchymosis upon resolution. Associated symptoms include: none. Suspected triggers are any antibiotics - penicillin, sulfa. Cipro also caused emesis.  She usually takes antibiotics for UTI's, post op prophylaxis.   Patient can take keflex without breaking out in hives.  No issues with food dyes/fillers that she is aware of.   She has tried the following therapies: zyrtec, benadryl, allegra with some benefit. Systemic steroids yes which did not help. Previous work up includes: no. Previous history of rash/hives: only if taking antibiotics. Otherwise she denies having hives on a regular basis.  Patient is up to date with the following cancer screening tests: no issues with pap smear and mammogram in the past.   Patient also developed a rash around her belly button area where the tape was and she had red hives where the incisions were s/p gallbladder surgery.   Patient follows with endocrinology due to hyperaldosteronism which causes issues with elevated BP.  I  reviewed the bloodwork. Blood count, autoimmune screener, inflammation markers, chronic urticaria index (checks for autoantibodies that trigger mast cells), tryptase (checks for mast cell issues) were all normal which is great.   Assessment and Plan: Bonnie Peck is a 55 y.o. female with: Multiple drug allergies Patient has been having issues with developing rash/hives after multiple different antibiotics use and seems to be getting worse over the years. Usually takes 2 days to appear but the last time it appeared on day 1. Takes antihistamines with some benefit. Systemic steroids do not seem to help. Otherwise patient denies having issues with breaking out in rash/hives without antibiotics on board. 2021 bloodwork (CBC diff, ANA, CU, tryptase, ESR) all normal.   Discussed at length that when patients have a long list of drug allergies, there is a question of whether the infections/stress itself on the body causes the rash and not necessarily the antibiotics OR if there's some type of inactive ingredient/filler in these medications causing the symptoms.  Get bloodwork as below.   Continue to avoid drugs on medication allergy list.   Take pictures and keep track of episodes.  Take pictures of the drug ingredient list.  Consider penicillin skin testing/drug challenge in the future.  Patient is hesitant about undergoing drug challenges and would like to wait until her dry eye issues resolved.   Return for Drug challenge.  Lab Orders     Tryptase     Allergen Gum Carageenan     Allergen, Red (Carmine) Dye, [id number]     ALLERGEN, ANNATTO SEED  CBC with Differential/Platelet     Gelatin,IgE  Diagnostics: None.  Medication List:  Current Outpatient Medications  Medication Sig Dispense Refill  . BLACK CURRANT SEED OIL PO Take by mouth.    . Cholecalciferol (VITAMIN D3) 1000 units CAPS Take by mouth.    . cyanocobalamin 100 MCG tablet Take 100 mcg daily by mouth.    . D-MANNOSE PO Take  2,000 mg by mouth daily.    Marland Kitchen eplerenone (INSPRA) 25 MG tablet Take 1 tablet (25 mg total) by mouth daily. To replace spironolactone 90 tablet 1  . Gamma-Aminobutyric Acid (GABA) 1000 MG/10ML SOLN Use as directed 500 mg in the mouth or throat daily.    . hydrALAZINE (APRESOLINE) 25 MG tablet Take as needed for blood pressure above 160. May take up to 4 doses per day. 120 tablet 0  . losartan (COZAAR) 50 MG tablet Take 1 tablet (50 mg total) by mouth daily. 90 tablet 1  . milk thistle 175 MG tablet Take 175 mg by mouth daily.    Marland Kitchen NALTREXONE-BUPROPION HCL ER PO Take 3 mg by mouth. Patient takes daily    . NATTOKINASE PO Take 2,000 PFU by mouth daily.    . Omega-3 Fatty Acids (FISH OIL PO) Take by mouth. With flax seed    . psyllium (METAMUCIL) 58.6 % powder Take 1 packet by mouth 3 (three) times daily. Take 2 tablespoons mixed with 8oz water    . Quercetin 50 MG TABS Take by mouth.    . Turmeric (CURCUMIN 95 PO) Take by mouth.    . zinc gluconate 50 MG tablet Take 50 mg by mouth daily. Take 1/2 tablet daily     No current facility-administered medications for this visit.   Allergies: Allergies  Allergen Reactions  . Cefaclor Diarrhea and Rash  . Cefdinir Diarrhea  . Ciprofloxacin Hives, Nausea And Vomiting and Rash  . Lisinopril Itching and Rash  . Nitrofurantoin Hives, Itching, Rash and Swelling  . Penicillins Hives and Rash    Can take Keflex without problems  . Sulfa Antibiotics Hives and Rash  . Wound Dressing Adhesive Dermatitis, Hives, Itching, Nausea Only, Rash and Swelling  . Amlodipine Hives and Rash  . Candesartan Rash    Mouth ulcers and vision changes Mouth ulcers and vision changes  . Clindamycin/Lincomycin Hives and Rash  . Cortisone Hives, Other (See Comments) and Swelling    She had hives and was given a cortisone shot. The shot made the hives worse.    Marland Kitchen Hydrochlorothiazide Other (See Comments)    Mouth dry   . Other Rash    Rash associated with suture strips   . Vancomycin Hives and Rash    (ALL MYCINS)   . Corticosteroids Hives  . Flonase [Fluticasone]     swelling  . Azithromycin Hives and Rash  . Erythromycin Rash    Per patient she is allergic to "all myocins"  . Labetalol Nausea Only    swelling swelling  . Losartan Other (See Comments)  . Nystatin Nausea Only  . Tetracyclines & Related Other (See Comments)   I reviewed her past medical history, social history, family history, and environmental history and no significant changes have been reported from her previous visit.  Review of Systems  Constitutional: Negative for appetite change, chills, fever and unexpected weight change.  HENT: Negative for congestion and rhinorrhea.   Eyes: Negative for itching.  Respiratory: Negative for cough, chest tightness, shortness of breath and wheezing.   Cardiovascular: Negative  for chest pain.  Gastrointestinal: Negative for abdominal pain.  Genitourinary: Negative for difficulty urinating.  Skin: Negative for rash.  Neurological: Negative for headaches.   Objective: BP (!) 158/70 (BP Location: Right Arm, Patient Position: Sitting, Cuff Size: Normal)   Pulse 98   Temp (!) 97.1 F (36.2 C) (Temporal)   Resp 18   Ht 5' 5.39" (1.661 m)   Wt 178 lb (80.7 kg)   LMP  (LMP Unknown)   SpO2 98%   BMI 29.26 kg/m  Body mass index is 29.26 kg/m. Physical Exam Vitals and nursing note reviewed.  Constitutional:      Appearance: Normal appearance. She is well-developed.  HENT:     Head: Normocephalic and atraumatic.     Right Ear: Tympanic membrane and external ear normal.     Left Ear: Tympanic membrane and external ear normal.     Nose: Nose normal.     Mouth/Throat:     Mouth: Mucous membranes are moist.     Pharynx: Oropharynx is clear.  Eyes:     Conjunctiva/sclera: Conjunctivae normal.  Cardiovascular:     Rate and Rhythm: Normal rate and regular rhythm.     Heart sounds: Normal heart sounds. No murmur heard. No friction rub.  No gallop.   Pulmonary:     Effort: Pulmonary effort is normal.     Breath sounds: Normal breath sounds. No wheezing, rhonchi or rales.  Musculoskeletal:     Cervical back: Neck supple.  Skin:    General: Skin is warm.     Findings: No rash.  Neurological:     Mental Status: She is alert and oriented to person, place, and time.  Psychiatric:        Behavior: Behavior normal.    Previous notes and tests were reviewed. The plan was reviewed with the patient/family, and all questions/concerned were addressed.  It was my pleasure to see Bonnie Peck today and participate in her care. Please feel free to contact me with any questions or concerns.  Sincerely,  Rexene Alberts, DO Allergy & Immunology  [facility] office: [place] office: [phone]

## 2020-02-02 NOTE — Patient Instructions (Addendum)
.   Get bloodwork:  o We are ordering labs, so please allow 1-2 weeks for the results to come back. o With the newly implemented Cures Act, the labs might be visible to you at the same time that they become visible to me. However, I will not address the results until all of the results are back, so please be patient.  o In the meantime, continue recommendations in your patient instructions, including avoidance measures (if applicable), until you hear from me.  Drug allergies:  Continue to avoid drugs on your drug allergy list.   Take pictures and keep track of episodes.  Take pictures of the drug ingredient list.   Consider penicillin skin testing/drug challenge in the future.  If interested we can schedule drug challenge to penicillin. You must be off antihistamines for 3-5 days before. Must be in good health and not ill. Plan on being in the office for 2-3 hours and must bring in the drug you want to do the oral challenge for - will send in prescription to pick up a few days before. You must call to schedule an appointment and specify it's for a drug challenge.   Follow up for penicillin skin testing and challenge.

## 2020-02-09 LAB — CBC WITH DIFFERENTIAL/PLATELET
Basophils Absolute: 0 10*3/uL (ref 0.0–0.2)
Basos: 1 %
EOS (ABSOLUTE): 0 10*3/uL (ref 0.0–0.4)
Eos: 0 %
Hematocrit: 43.7 % (ref 34.0–46.6)
Hemoglobin: 14.4 g/dL (ref 11.1–15.9)
Immature Grans (Abs): 0 10*3/uL (ref 0.0–0.1)
Immature Granulocytes: 0 %
Lymphocytes Absolute: 2.3 10*3/uL (ref 0.7–3.1)
Lymphs: 33 %
MCH: 29.1 pg (ref 26.6–33.0)
MCHC: 33 g/dL (ref 31.5–35.7)
MCV: 88 fL (ref 79–97)
Monocytes Absolute: 0.5 10*3/uL (ref 0.1–0.9)
Monocytes: 7 %
Neutrophils Absolute: 3.9 10*3/uL (ref 1.4–7.0)
Neutrophils: 59 %
Platelets: 332 10*3/uL (ref 150–450)
RBC: 4.95 x10E6/uL (ref 3.77–5.28)
RDW: 13.1 % (ref 11.7–15.4)
WBC: 6.8 10*3/uL (ref 3.4–10.8)

## 2020-02-09 LAB — ALLERGEN GUM CARAGEENAN
Carageenan Gum, IgE: <0.35 kU/L (ref <0.35)
Class Interpretation: 0

## 2020-02-09 LAB — ALLERGEN, RED (CARMINE) DYE, RF340: F340-IgE Carmine Red Dye: <0.1 kU/L

## 2020-02-09 LAB — TRYPTASE: Tryptase: 5 ug/L (ref 2.2–13.2)

## 2020-02-09 LAB — ALLERGEN, ANNATTO SEED
Annatto Seed IgE*: <0.35 kU/L (ref <0.35)
Class Interpretation: 0

## 2020-02-09 LAB — C074-IGE GELATIN: C074-IgE Gelatin: <0.1 kU/L

## 2020-02-15 ENCOUNTER — Other Ambulatory Visit: Payer: Self-pay | Admitting: Pharmacist

## 2020-02-15 MED ORDER — COZAAR 50 MG PO TABS: 50.0000 mg | ORAL_TABLET | Freq: Every day | ORAL | 3 refills | Status: DC

## 2020-02-18 ENCOUNTER — Encounter: Payer: Self-pay | Admitting: *Deleted

## 2020-04-13 ENCOUNTER — Other Ambulatory Visit: Payer: Self-pay | Admitting: Cardiology

## 2020-05-04 ENCOUNTER — Ambulatory Visit: Payer: Managed Care, Other (non HMO) | Admitting: Cardiology

## 2020-05-16 DIAGNOSIS — D3500 Benign neoplasm of unspecified adrenal gland: Secondary | ICD-10-CM

## 2020-05-16 DIAGNOSIS — I10 Essential (primary) hypertension: Secondary | ICD-10-CM

## 2020-05-23 ENCOUNTER — Other Ambulatory Visit: Payer: Self-pay | Admitting: Cardiology

## 2020-05-24 MED ORDER — METOPROLOL SUCCINATE ER 50 MG PO TB24: 50.0000 mg | ORAL_TABLET | Freq: Every day | ORAL | 3 refills | Status: DC

## 2020-05-24 NOTE — Addendum Note (Signed)
Addended by: Cristopher Estimable on: 05/24/2020 10:20 AM   Modules accepted: Orders

## 2020-05-25 ENCOUNTER — Encounter: Payer: Self-pay | Admitting: *Deleted

## 2020-05-25 ENCOUNTER — Telehealth: Payer: Self-pay | Admitting: Cardiology

## 2020-05-25 NOTE — Telephone Encounter (Signed)
Sherlynn Carbon from [facility] was returning a call from Hilda Blades in regards to a referral from our office for this patient.  Please call Vicente Males

## 2020-05-25 NOTE — Telephone Encounter (Signed)
Spoke with Bonnie Peck, she reports the referral was closed because the patient was seen at [facility]. Aware the patient is wanting a second option from them. She will reopen the referral and have the doctors review.

## 2020-05-25 NOTE — Telephone Encounter (Signed)
Returned call to Sherlynn Carbon at [place] at [employer]. Vicente Males states that she had received a call from Hilda Blades here at the office but was unsure of what the question was. Manuella Ghazi that I would forward message to Hilda Blades for her to review.

## 2020-05-26 NOTE — Progress Notes (Signed)
Name: Bonnie Peck  MRN/ DOB: [id number], 1965-08-21    Age/ Sex: 55 y.o., female    PCP: Zara Chess, NP   Reason for Endocrinology Evaluation: HTN     Date of Initial Endocrinology Evaluation: 05/26/2020     HPI: [name] is a 55 y.o. 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 The patient presented for initial endocrinology clinic visit on 05/26/2020 for consultative assistance with her HTN  She is accompanied by her spousee today    Of note, the pt has been evaluated for MNG through [facility] . Per care everywhere she was diagnosed with MNG in 07/2018, she  had an FNA of the left lower 1.2 cm nodule on 08/22/2018 with benign cytology. Repeat ultrasound in 01/2019 showed stability of the nodules, and is not due until 2023  She has a history of HTN for years that has been able to control with intermittent antihypertensives and lifestyle changes until last year when her BP became out of control. She coincides her HTN to receiving the second dose of COVID vaccine.    She was noted to have a suppressed renin at < 0.167 ng/mL/h with an aldo level of 15.9 ng/dL and a ratio of > 95.2 .   She was evaluated by  [facility] End[facility] 09/2019 . Repeat renin and aldo were normal at [facility] as well as metanephrines, of note she was on Epleronone during that time.  They attempted to take her off Epleronone to proceed with saline loading but ended up with hypertensive urgency.    She was on Spironolactone in 08/2019 , caused mastalgia  Epleronone was started shortly after    Has occasional headaches and blurring vision as well as swishing ear sounds  She is in a low salt diet as well as low caffeine diet  She is not on licorice   Mother with HTN      HOME BP MEDS:  Losartan 100 mg daily  Metoprolol succinate  50 mg daily  Epleronone 25 mg Two tablets daily      HISTORY:  Past Medical History:  Past Medical History:  Diagnosis Date  .  Hypertension   . Lung nodule   . Solitary kidney   . Urticaria     Past Surgical History:  Past Surgical History:  Procedure Laterality Date  . APPENDECTOMY  age 58  . CESAREAN SECTION  1998  . CHOLECYSTECTOMY    . KIDNEY SURGERY Left born   . RHINOPLASTY    . SINOSCOPY    . TONSILLECTOMY    . UTERINE SEPTUM RESECTION        Social History:  reports that she has never smoked. She has never used smokeless tobacco. She reports current alcohol use. She reports that she does not use drugs.  Family History: family history includes Colon cancer in her father; Diabetes in her mother; Heart attack in her maternal grandmother and mother; Hyperlipidemia in her father; Hypertension in her mother; Melanoma in her maternal grandmother.   HOME MEDICATIONS: Allergies as of 05/27/2020      Reactions   Cefaclor Diarrhea, Rash   Cefdinir Diarrhea   Ciprofloxacin Hives, Nausea And Vomiting, Rash   Lisinopril Itching, Rash   Nitrofurantoin Hives, Itching, Rash, Swelling   Penicillins Hives, Rash   Can take Keflex without problems   Sulfa Antibiotics Hives, Rash   Wound Dressing Adhesive Dermatitis, Hives, Itching, Nausea Only, Rash, Swelling  Amlodipine Hives, Rash   Candesartan Rash   Mouth ulcers and vision changes Mouth ulcers and vision changes   Clindamycin/lincomycin Hives, Rash   Cortisone Hives, Other (See Comments), Swelling   She had hives and was given a cortisone shot. The shot made the hives worse.    Hydrochlorothiazide Other (See Comments)   Mouth dry   Other Rash   Rash associated with suture strips   Vancomycin Hives, Rash   (ALL MYCINS)   Corticosteroids Hives   Flonase [fluticasone]    swelling   Azithromycin Hives, Rash   Erythromycin Rash   Per patient she is allergic to "all myocins"   Labetalol Nausea Only   swelling swelling   Nystatin Nausea Only   Tetracyclines & Related Other (See Comments)      Medication List       Accurate as of May 26, 2020   8:16 PM. If you have any questions, ask your nurse or doctor.        BLACK CURRANT SEED OIL PO Take by mouth.   Cozaar 50 MG tablet Generic drug: losartan Take 1 tablet (50 mg total) by mouth daily.   losartan 50 MG tablet Commonly known as: COZAAR Take 1 tablet (50 mg total) by mouth daily.   CURCUMIN 95 PO Take by mouth.   cyanocobalamin 100 MCG tablet Take 100 mcg daily by mouth.   D-MANNOSE PO Take 2,000 mg by mouth daily.   eplerenone 25 MG tablet Commonly known as: INSPRA Take 1 tablet (25 mg total) by mouth daily. To replace spironolactone   FISH OIL PO Take by mouth. With flax seed   GABA 1000 MG/10ML Soln Use as directed 500 mg in the mouth or throat daily.   hydrALAZINE 25 MG tablet Commonly known as: APRESOLINE Take as needed for blood pressure above 160. May take up to 4 doses per day.   metoprolol succinate 50 MG 24 hr tablet Commonly known as: TOPROL-XL Take 1 tablet (50 mg total) by mouth daily. Take with or immediately following a meal.   milk thistle 175 MG tablet Take 175 mg by mouth daily.   NALTREXONE-BUPROPION HCL ER PO Take 3 mg by mouth. Patient takes daily   NATTOKINASE PO Take 2,000 PFU by mouth daily.   psyllium 58.6 % powder Commonly known as: METAMUCIL Take 1 packet by mouth 3 (three) times daily. Take 2 tablespoons mixed with 8oz water   Quercetin 50 MG Tabs Take by mouth.   Vitamin D3 25 MCG (1000 UT) Caps Take by mouth.   zinc gluconate 50 MG tablet Take 50 mg by mouth daily. Take 1/2 tablet daily         REVIEW OF SYSTEMS: A comprehensive ROS was conducted with the patient and is negative except as per HPI and below:  ROS     OBJECTIVE:  VS: BP (!) 164/78   Pulse 70   Ht 5\' 4"  (1.626 m)   Wt 178 lb (80.7 kg)   LMP  (LMP Unknown)   SpO2 98%   BMI 30.55 kg/m    Wt Readings from Last 3 Encounters:  02/02/20 178 lb (80.7 kg)  01/27/20 175 lb 12.8 oz (79.7 kg)  01/13/20 171 lb (77.6 kg)      EXAM: General: Pt appears well and is in NAD  Neck: General: Supple without adenopathy. Thyroid: Thyroid size normal.  No goiter or nodules appreciated. No thyroid bruit.  Lungs: Clear with good BS bilat with no rales, rhonchi,  or wheezes  Heart: Auscultation: RRR.  Abdomen: Normoactive bowel sounds, soft, nontender, without masses or organomegaly palpable  Extremities:  BL LE: No pretibial edema normal ROM and strength.  Skin: Hair: Texture and amount normal with gender appropriate distribution Skin Inspection: No rashes Skin Palpation: Skin temperature, texture, and thickness normal to palpation  Neuro: Cranial nerves: II - XII grossly intact  Motor: Normal strength throughout DTRs: 2+ and symmetric in UE without delay in relaxation phase  Mental Status: Judgment, insight: Intact Orientation: Oriented to time, place, and person Mood and affect: No depression, anxiety, or agitation     DATA REVIEWED:  Results for HALIA, FRANEY (MRN [id number]) as of 05/30/2020 19:39  Ref. Range 11/03/2019 11:19  Sodium Latest Ref Range: 134 - 144 mmol/L 141  Potassium Latest Ref Range: 3.5 - 5.2 mmol/L 4.3  Chloride Latest Ref Range: 96 - 106 mmol/L 105  CO2 Latest Ref Range: 20 - 29 mmol/L 22  Glucose Latest Ref Range: 65 - 99 mg/dL 89  BUN Latest Ref Range: 6 - 24 mg/dL 15  Creatinine Latest Ref Range: 0.57 - 1.00 mg/dL 0.77  Calcium Latest Ref Range: 8.7 - 10.2 mg/dL 9.5  BUN/Creatinine Ratio Latest Ref Range: 9 - 23  19  GFR, Est Non African American Latest Ref Range: >59 mL/min/1.73 89  GFR, Est African American Latest Ref Range: >59 mL/min/1.73 103   FNA 08/22/2018: Final Diagnosis  Thyroid, Left Inferior Lobe; FNA (smears, ThinPrep): Findings are consistent with a benign hyperplastic or goitrous thyroid nodule with cystic degeneration (multinodular goiter).  Bethesda System Category II     04/01/2020 TSH 1.31   04/05/2020 Aldosterone 16 ng/dL  Renin 2.3 ng/mL/h Na 141 K  4.3 BUN/Cr 6/0.7 GFR 103  04/11/2020 Normetanephrine 113.9 (0.0-244) Metanephrine 43.3 (0.0-88.0)     MRI 09/04/2019  No evidence of thoracic or abdominal aortic aneurysm or dissection. 2. Congenital absence of the left kidney with compensatory hypertrophy of the right kidney. 3. Scattered very minimal amount of atherosclerotic plaque within the abdominal aorta. Aortic Atherosclerosis (ICD10-I70.0). 4. Colonic diverticulosis without evidence superimposed acute diverticulitis.    Thyroid Ultrasound 06/28/2017  Estimated total number of nodules >/= 1 cm: 0  Number of spongiform nodules >/=  2 cm not described below (TR1): 0  Number of mixed cystic and solid nodules >/= 1.5 cm not described below ([place]): 0  _________________________________________________________  Nodule # 3:  Location: Left; Mid  Maximum size: 0.9 cm; Other 2 dimensions: 0.9 x 0.6 cm  Composition: solid/almost completely solid (2)  Echogenicity: hypoechoic (2)  Shape: not taller-than-wide (0)  Margins: smooth (0)  Echogenic foci: punctate echogenic foci (3)  ACR TI-RADS total points: 7.  ACR TI-RADS risk category: TR5 (>/= 7 points).  ACR TI-RADS recommendations:  *Given size (>/= 0.5 - 0.9 cm) and appearance, a follow-up ultrasound in 1 year should be considered based on TI-RADS criteria.  _________________________________________________________  Nodule # 4:  Location: Left; Inferior  Maximum size: 0.8 cm; Other 2 dimensions: 0.5 x 0.5 cm  Composition: solid/almost completely solid (2)  Echogenicity: hypoechoic (2)  Shape: not taller-than-wide (0)  Margins: smooth (0)  Echogenic foci: punctate echogenic foci (3)  ACR TI-RADS total points: 7.  ACR TI-RADS risk category: TR5 (>/= 7 points).  ACR TI-RADS recommendations:  *Given size (>/= 0.5 - 0.9 cm) and appearance, a follow-up ultrasound in 1 year should be considered based on TI-RADS  criteria.  _________________________________________________________  Right lobe nodules measure 0.6 cm or less and  do not meet criteria for biopsy nor follow-up.  IMPRESSION: Left nodules 3 and 4 both meet criteria for annual follow-up. ASSESSMENT/PLAN/RECOMMENDATIONS:   1. HTN:   - Pt with uncontrolled HTN  - She is requesting adrenal imaging but I have assured her that her recent MRA did not show adrenal nodules and if we were to fine adrenal nodule, that does not confirm a diagnosis of Primary hyperaldo. As most of adrenal adenoma as non-secretary and will have to proceed with confirmatory testing  - She is very worried about going off Epleronone as a previous attempt resulted in hypertensive urgency .  - She is also worried about trying new antihypertensive meds due to sensitivity to previous medications  - She is worried about having a heart attack or a stroke with thse elevated BP  - I have explained to the pt that epleronone is perfectly acceptable medical management for primary hyper aldo but again she states she would like to get to the bottom of this.  - I have advised to focus on prioritizing optimizing her BP first  - She also understands the steps for confirming primary hyperaldo to include confirmatory testing , and adrenal venous samples. I explained to her that sometimes AVS can be non-lateralizing and medical management would be the recommended option . - I am going to increase her Metoprolol - I am also going to proceed with cushing syndrome screening and repeating pheochromocytoma  - She is intolerant to HCTZ, Amlodipine, Labetalol  and candesartan   Medications :  Continue Losartan 100 mg daily  Increase Metoprolol 100  mg daily  Continue Epleronone 25 mg Two tablets daily     I spent 45 minutes preparing to see the patient by review of recent labs, imaging and procedures, obtaining and reviewing separately obtained history, communicating with the  patient/family or caregiver, ordering medications, tests or procedures, and documenting clinical information in the EHR including the differential Dx, treatment, and any further evaluation and other management   Signed electronically by: Mack Guise, MD  [facility] Endocrinology  [facility] [address] [place],  [address] Phone: [phone] FAX: [phone]   CC: Zara Chess, NP [address] [place] [address] Phone: [phone] Fax: [phone]   Return to Endocrinology clinic as below: Future Appointments  Date Time Provider [place]  05/27/2020 10:50 AM Kinlee Garrison, Melanie Crazier, MD [facility] None  06/24/2020 10:00 AM Marrian Salvage, FNP [facility] [place]

## 2020-05-27 ENCOUNTER — Other Ambulatory Visit: Payer: Self-pay

## 2020-05-27 ENCOUNTER — Ambulatory Visit (INDEPENDENT_AMBULATORY_CARE_PROVIDER_SITE_OTHER): Payer: Managed Care, Other (non HMO) | Admitting: Internal Medicine

## 2020-05-27 ENCOUNTER — Encounter: Payer: Self-pay | Admitting: Internal Medicine

## 2020-05-27 VITALS — BP 164/78 | HR 70 | Ht 64.0 in | Wt 178.0 lb

## 2020-05-27 DIAGNOSIS — I10 Essential (primary) hypertension: Secondary | ICD-10-CM | POA: Diagnosis not present

## 2020-05-27 MED ORDER — METOPROLOL SUCCINATE ER 100 MG PO TB24: 100.0000 mg | ORAL_TABLET | Freq: Every day | ORAL | 3 refills | Status: DC

## 2020-05-27 NOTE — Patient Instructions (Addendum)
Continue Losartan 100 mg daily  Increase Metoprolol 100  mg daily  Continue Epleronone 25 mg Two tablets daily     24-Hour Urine Collection   You will be collecting your urine for a 24-hour period of time.  Your timer starts with your first urine of the morning (For example - If you first pee at [place], your timer will start at [place])  [place] away your first urine of the morning  Collect your urine every time you pee for the next 24 hours STOP your urine collection 24 hours after you started the collection (For example - You would stop at 9AM the day after you started)

## 2020-05-28 ENCOUNTER — Encounter: Payer: Self-pay | Admitting: Internal Medicine

## 2020-05-30 ENCOUNTER — Encounter: Payer: Self-pay | Admitting: Internal Medicine

## 2020-05-31 ENCOUNTER — Other Ambulatory Visit (INDEPENDENT_AMBULATORY_CARE_PROVIDER_SITE_OTHER): Payer: Managed Care, Other (non HMO)

## 2020-05-31 ENCOUNTER — Encounter: Payer: Self-pay | Admitting: Internal Medicine

## 2020-05-31 ENCOUNTER — Other Ambulatory Visit: Payer: Self-pay

## 2020-05-31 DIAGNOSIS — I10 Essential (primary) hypertension: Secondary | ICD-10-CM | POA: Diagnosis not present

## 2020-06-01 ENCOUNTER — Other Ambulatory Visit: Payer: Self-pay

## 2020-06-01 ENCOUNTER — Encounter: Payer: Self-pay | Admitting: Internal Medicine

## 2020-06-01 ENCOUNTER — Encounter: Payer: Self-pay | Admitting: Family

## 2020-06-01 ENCOUNTER — Ambulatory Visit (INDEPENDENT_AMBULATORY_CARE_PROVIDER_SITE_OTHER): Payer: Managed Care, Other (non HMO) | Admitting: Family

## 2020-06-01 VITALS — BP 160/80 | HR 75 | Temp 98.8°F | Ht 64.0 in | Wt 177.4 lb

## 2020-06-01 DIAGNOSIS — I10 Essential (primary) hypertension: Secondary | ICD-10-CM | POA: Diagnosis not present

## 2020-06-01 DIAGNOSIS — F418 Other specified anxiety disorders: Secondary | ICD-10-CM

## 2020-06-01 DIAGNOSIS — Q600 Renal agenesis, unilateral: Secondary | ICD-10-CM | POA: Diagnosis not present

## 2020-06-01 LAB — COMPREHENSIVE METABOLIC PANEL
ALT: 15 U/L (ref 0–35)
AST: 14 U/L (ref 0–37)
Albumin: 4.6 g/dL (ref 3.5–5.2)
Alkaline Phosphatase: 74 U/L (ref 39–117)
BUN: 11 mg/dL (ref 6–23)
CO2: 26 mEq/L (ref 19–32)
Calcium: 9.4 mg/dL (ref 8.4–10.5)
Chloride: 106 mEq/L (ref 96–112)
Creatinine, Ser: 0.76 mg/dL (ref 0.40–1.20)
GFR: 89.49 mL/min (ref >60.00)
Glucose, Bld: 96 mg/dL (ref 70–99)
Potassium: 4.2 mEq/L (ref 3.5–5.1)
Sodium: 139 mEq/L (ref 135–145)
Total Bilirubin: 0.6 mg/dL (ref 0.2–1.2)
Total Protein: 6.6 g/dL (ref 6.0–8.3)

## 2020-06-01 LAB — CREATININE, URINE, 24 HOUR
Creatinine, 24H Ur: 1050 mg/24 hr (ref 800–1800)
Creatinine, Urine: 44.7 mg/dL

## 2020-06-01 MED ORDER — LORAZEPAM 0.5 MG PO TABS: ORAL_TABLET | ORAL | 0 refills | Status: AC

## 2020-06-01 MED ORDER — NITROGLYCERIN 0.4 MG SL SUBL: 0.4000 mg | SUBLINGUAL_TABLET | SUBLINGUAL | 3 refills | Status: AC | PRN

## 2020-06-01 MED ORDER — METOPROLOL SUCCINATE ER 100 MG PO TB24: ORAL_TABLET | ORAL | 1 refills | Status: AC

## 2020-06-01 NOTE — Progress Notes (Signed)
Bonnie Peck is a 55 y.o. female with the following history as recorded in EpicCare:  Patient Active Problem List   Diagnosis Date Noted  . Anxiety 09/15/2019  . Solitary pulmonary nodule 07/17/2019  . Multiple drug allergies 07/09/2019  . History of urticaria 07/09/2019  . Immunization reaction 07/09/2019  . Calculus of gallbladder without cholecystitis without obstruction 11/21/2016  . Globus sensation 11/21/2016  . Prepatellar bursitis of right knee 08/16/2015  . Hematuria 04/19/2015  . Essential hypertension 03/11/2015  . Kidney congenitally absent, left 03/11/2015    Current Outpatient Medications  Medication Sig Dispense Refill  . Ascorbic Acid (VITAMIN C) 1000 MG tablet Take 1,000 mg by mouth daily. LIPOSOMAL    . Cholecalciferol (VITAMIN D3) 1000 units CAPS Take by mouth.    . cyanocobalamin 100 MCG tablet Take 100 mcg daily by mouth.    . D-MANNOSE PO Take 2,000 mg by mouth daily.    Marland Kitchen eplerenone (INSPRA) 25 MG tablet Take 1 tablet (25 mg total) by mouth daily. To replace spironolactone 90 tablet 1  . Gamma-Aminobutyric Acid (GABA) 1000 MG/10ML SOLN Use as directed 500 mg in the mouth or throat daily.    Marland Kitchen losartan (COZAAR) 100 MG tablet Take 1 tablet by mouth daily.    Marland Kitchen NALTREXONE-BUPROPION HCL ER PO Take 3 mg by mouth. Patient takes daily    . NATTOKINASE PO Take 2,000 PFU by mouth daily.    . nitroGLYCERIN (NITROSTAT) 0.4 MG SL tablet Place 1 tablet (0.4 mg total) under the tongue every 5 (five) minutes as needed for chest pain. 50 tablet 3  . psyllium (METAMUCIL) 58.6 % powder Take 1 packet by mouth 3 (three) times daily. Take 2 tablespoons mixed with 8oz water    . Quercetin 50 MG TABS Take by mouth.    . zinc gluconate 50 MG tablet Take 50 mg by mouth daily. Take 1/2 tablet daily    . LORazepam (ATIVAN) 0.5 MG tablet Take 1-2 tablets every 8 hours as needed 60 tablet 0  . metoprolol succinate (TOPROL-XL) 100 MG 24 hr tablet Take 2 tablets daily as directed 180 tablet  1   No current facility-administered medications for this visit.    Allergies: Cefaclor, Cefdinir, Ciprofloxacin, Hydralazine, Lisinopril, Nitrofurantoin, Penicillins, Sulfa antibiotics, Wound dressing adhesive, Amlodipine, Candesartan, Clindamycin/lincomycin, Cortisone, Hydrochlorothiazide, Other, Vancomycin, Corticosteroids, Flonase [fluticasone], Azithromycin, Erythromycin, Labetalol, Nystatin, and Tetracyclines & related  Past Medical History:  Diagnosis Date  . Allergy   . GERD (gastroesophageal reflux disease)   . Hypertension   . Lung nodule   . Solitary kidney   . Urticaria     Past Surgical History:  Procedure Laterality Date  . APPENDECTOMY  age 72  . CESAREAN SECTION  1998  . CHOLECYSTECTOMY    . KIDNEY SURGERY Left born   . RHINOPLASTY    . SINOSCOPY    . TONSILLECTOMY    . UTERINE SEPTUM RESECTION      Family History  Problem Relation Age of Onset  . Melanoma Maternal Grandmother   . Heart attack Maternal Grandmother   . Diabetes Maternal Grandmother   . Kidney disease Maternal Grandmother   . Heart attack Mother   . Diabetes Mother   . Hypertension Mother   . Heart disease Mother   . Hyperlipidemia Father   . Colon cancer Father   . Cancer Father   . Hearing loss Paternal Grandmother   . Allergic rhinitis Neg Hx   . Asthma Neg Hx   .  Eczema Neg Hx   . Urticaria Neg Hx     Social History   Tobacco Use  . Smoking status: Never Smoker  . Smokeless tobacco: Never Used  Substance Use Topics  . Alcohol use: Yes    Comment: rare    Subjective:  Presents today as a new patient; accompanied by her husband; Very complicated blood pressure issues- only has one kidney but 2 adrenal glands; in the process of evaluation for primary aldosteronism with endocrinology; Has been asked to get updated prescription for Toprol XL to give her the option to take a second Toprol dosage as needed for blood pressure spikes; Also concerned about the secondary anxiety that  occurs with the blood pressure spikes- currently takes Ativan 0.5 mg prn; Also needs refill on Nitroglycerin which she uses with benefit for blood pressure spikes;  Does have GYN and is up to date on mammogram/ pap smear; Colonoscopy was done in April 2022- repeat due in 10 years;   Objective:  Vitals:   06/01/20 1006  BP: (!) 160/80  Pulse: 75  Temp: 98.8 F (37.1 C)  TempSrc: Oral  SpO2: 99%  Weight: 177 lb 6.4 oz (80.5 kg)  Height: 5' 4" (1.626 m)    General: Well developed, well nourished, in no acute distress  Skin : Warm and dry.  Head: Normocephalic and atraumatic  Lungs: Respirations unlabored; clear to auscultation bilaterally without wheeze, rales, rhonchi  CVS exam: normal rate and regular rhythm.  Neurologic: Alert and oriented; speech intact; face symmetrical; moves all extremities well; CNII-XII intact without focal deficit   Assessment:  1. Hypertension, unspecified type   2. Situational anxiety   3. Kidney congenitally absent, left     Plan:  1. Rx for Toprol XL 100 mg- Rx is adjusted to give her the option to take a second dosage as needed; continue with endocrine as scheduled; 2. Rx for Ativan is adjusted to allow her to take 1-2 tablets every 8 hours as needed for anxiety; 3. Refer to nephrology for initial evaluation;  This visit occurred during the SARS-CoV-2 public health emergency.  Safety protocols were in place, including screening questions prior to the visit, additional usage of staff PPE, and extensive cleaning of exam room while observing appropriate contact time as indicated for disinfecting solutions.      No follow-ups on file.  Orders Placed This Encounter  Procedures  . Comp Met (CMET)    Requested Prescriptions   Signed Prescriptions Disp Refills  . metoprolol succinate (TOPROL-XL) 100 MG 24 hr tablet 180 tablet 1    Sig: Take 2 tablets daily as directed  . LORazepam (ATIVAN) 0.5 MG tablet 60 tablet 0    Sig: Take 1-2 tablets every  8 hours as needed  . nitroGLYCERIN (NITROSTAT) 0.4 MG SL tablet 50 tablet 3    Sig: Place 1 tablet (0.4 mg total) under the tongue every 5 (five) minutes as needed for chest pain.

## 2020-06-02 ENCOUNTER — Other Ambulatory Visit: Payer: Self-pay | Admitting: Chiropractic Medicine

## 2020-06-02 ENCOUNTER — Encounter: Payer: Self-pay | Admitting: Family

## 2020-06-02 DIAGNOSIS — G894 Chronic pain syndrome: Secondary | ICD-10-CM

## 2020-06-03 ENCOUNTER — Ambulatory Visit: Payer: Managed Care, Other (non HMO) | Admitting: *Deleted

## 2020-06-03 ENCOUNTER — Other Ambulatory Visit: Payer: Self-pay

## 2020-06-03 VITALS — BP 136/98 | HR 66

## 2020-06-03 DIAGNOSIS — I10 Essential (primary) hypertension: Secondary | ICD-10-CM

## 2020-06-04 ENCOUNTER — Encounter: Payer: Self-pay | Admitting: Internal Medicine

## 2020-06-06 ENCOUNTER — Encounter: Payer: Self-pay | Admitting: Internal Medicine

## 2020-06-08 LAB — CATECHOLAMINE+VMA, 24-HR URINE
Dopamine , 24H Ur: 207 ug/24 hr (ref 0–510)
Dopamine, Rand Ur: 88 ug/L
Epinephrine, 24H Ur: 7 ug/24 hr (ref 0–20)
Epinephrine, Rand Ur: 3 ug/L
Norepinephrine, 24H Ur: 47 ug/24 hr (ref 0–135)
Norepinephrine, Rand Ur: 20 ug/L
VMA, 24H Ur Adult: 2.8 mg/24 hr (ref 0.0–7.5)
VMA, Urine: 1.2 mg/L

## 2020-06-08 LAB — CORTISOL, URINE, FREE
Cortisol (Ur), Free: 19 ug/24 hr (ref 6–42)
Cortisol,F,ug/L,U: 8 ug/L

## 2020-06-09 ENCOUNTER — Encounter: Payer: Self-pay | Admitting: Family

## 2020-06-13 ENCOUNTER — Ambulatory Visit
Admission: RE | Admit: 2020-06-13 | Discharge: 2020-06-13 | Disposition: A | Discharge status: Home / Self Care | Payer: Managed Care, Other (non HMO) | Source: Ambulatory Visit | Attending: Chiropractic Medicine | Admitting: Chiropractic Medicine

## 2020-06-13 ENCOUNTER — Encounter: Payer: Self-pay | Admitting: Internal Medicine

## 2020-06-13 DIAGNOSIS — G894 Chronic pain syndrome: Secondary | ICD-10-CM

## 2020-06-15 ENCOUNTER — Encounter: Payer: Self-pay | Admitting: Family

## 2020-06-17 ENCOUNTER — Other Ambulatory Visit: Payer: Self-pay | Admitting: Family

## 2020-06-17 MED ORDER — LOSARTAN POTASSIUM 100 MG PO TABS: 1.0000 | ORAL_TABLET | Freq: Every day | ORAL | 1 refills | Status: AC

## 2020-06-22 ENCOUNTER — Other Ambulatory Visit: Payer: Self-pay | Admitting: Nephrology

## 2020-06-22 DIAGNOSIS — E278 Other specified disorders of adrenal gland: Secondary | ICD-10-CM

## 2020-06-23 ENCOUNTER — Ambulatory Visit: Payer: Managed Care, Other (non HMO) | Admitting: Internal Medicine

## 2020-06-24 ENCOUNTER — Ambulatory Visit: Payer: Managed Care, Other (non HMO) | Admitting: Family

## 2020-07-02 ENCOUNTER — Other Ambulatory Visit: Payer: Self-pay

## 2020-07-19 NOTE — Progress Notes (Signed)
HPI: FU hypertension. Previously evaluated at [facility] for hypertension that increased following Covid vaccine. 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 Carotid dopplers 5/21 showed no significant stenosis. Echo 6/21 showed normal LV function.  Patient states that since her Covid vaccine 4/21 she has had spikes in her blood pressure as high as 240/120. CTA September 2021 here in [place] showed no thoracic or abdominal aortic aneurysm, congenital absence of the left kidney and minimal atherosclerotic plaque in the abdominal aorta; no renal artery stenosis on the right.  Laboratories August 2021 showed normal TSH, normal metanephrines and normal aldosterone level, low renin level at less than 1.167 with an aldosterone to renin ratio of greater than 95.  Laboratories May 2022 showed normal catecholamines and VMA, normal urine cortisol.  Patient is now being evaluated at [facility] and there is plan for adrenal vein sampling.  Since last seen her Toprol was increased to 200 mg daily for elevated blood pressure.  However she developed fatigue and bradycardia on this dose.  This was reduced to 50 mg and recently she was instructed to increase to 75 mg.  Her symptoms have improved.  She denies dyspnea or syncope.  She does not have exertional chest pain.  Current Outpatient Medications  Medication Sig Dispense Refill   Ascorbic Acid (VITAMIN C) 1000 MG tablet Take 1,000 mg by mouth daily. LIPOSOMAL     Cholecalciferol (VITAMIN D3) 1000 units CAPS Take by mouth.     cyanocobalamin 100 MCG tablet Take 100 mcg daily by mouth.     D-MANNOSE PO Take 2,000 mg by mouth daily.     eplerenone (INSPRA) 25 MG tablet Take 1 tablet (25 mg total) by mouth daily. To replace spironolactone 90 tablet 1   Gamma-Aminobutyric Acid (GABA) 1000 MG/10ML SOLN Use as directed 500 mg in the mouth or throat daily.     LORazepam (ATIVAN) 0.5 MG tablet Take 1-2 tablets every 8 hours as needed 60 tablet 0   losartan (COZAAR) 100 MG tablet Take  1 tablet (100 mg total) by mouth daily. 90 tablet 1   metoprolol succinate (TOPROL-XL) 100 MG 24 hr tablet Take 2 tablets daily as directed 180 tablet 1   NALTREXONE-BUPROPION HCL ER PO Take 3 mg by mouth. Patient takes daily     NATTOKINASE PO Take 2,000 PFU by mouth daily.     nitroGLYCERIN (NITROSTAT) 0.4 MG SL tablet Place 1 tablet (0.4 mg total) under the tongue every 5 (five) minutes as needed for chest pain. 50 tablet 3   psyllium (METAMUCIL) 58.6 % powder Take 1 packet by mouth 3 (three) times daily. Take 2 tablespoons mixed with 8oz water     Quercetin 50 MG TABS Take by mouth.     zinc gluconate 50 MG tablet Take 50 mg by mouth daily. Take 1/2 tablet daily     No current facility-administered medications for this visit.     Past Medical History:  Diagnosis Date   Allergy    GERD (gastroesophageal reflux disease)    Hypertension    Lung nodule    Solitary kidney    Urticaria     Past Surgical History:  Procedure Laterality Date   APPENDECTOMY  age 55   CESAREAN SECTION  1998   CHOLECYSTECTOMY     KIDNEY SURGERY Left born    RHINOPLASTY     SINOSCOPY     TONSILLECTOMY     UTERINE SEPTUM RESECTION      Social  History   Socioeconomic History   Marital status: Married    Spouse name: Not on file   Number of children: 2   Years of education: Not on file   Highest education level: Not on file  Occupational History   Not on file  Tobacco Use   Smoking status: Never   Smokeless tobacco: Never  Vaping Use   Vaping Use: Never used  Substance and Sexual Activity   Alcohol use: Yes    Comment: rare   Drug use: No   Sexual activity: Yes  Other Topics Concern   Not on file  Social History Narrative   Not on file   Social Determinants of Health   Financial Resource Strain: Not on file  Food Insecurity: Not on file  Transportation Needs: Not on file  Physical Activity: Not on file  Stress: Not on file  Social Connections: Not on file  Intimate Partner  Violence: Not on file    Family History  Problem Relation Age of Onset   Melanoma Maternal Grandmother    Heart attack Maternal Grandmother    Diabetes Maternal Grandmother    Kidney disease Maternal Grandmother    Heart attack Mother    Diabetes Mother    Hypertension Mother    Heart disease Mother    Hyperlipidemia Father    Colon cancer Father    Cancer Father    Hearing loss Paternal Grandmother    Allergic rhinitis Neg Hx    Asthma Neg Hx    Eczema Neg Hx    Urticaria Neg Hx     ROS: no fevers or chills, productive cough, hemoptysis, dysphasia, odynophagia, melena, hematochezia, dysuria, hematuria, rash, seizure activity, orthopnea, PND, pedal edema, claudication. Remaining systems are negative.  Physical Exam: Well-developed well-nourished in no acute distress.  Skin is warm and dry.  HEENT is normal.  Neck is supple.  Chest is clear to auscultation with normal expansion.  Cardiovascular exam is regular rate and rhythm.  Abdominal exam nontender or distended. No masses palpated. Extremities show no edema. neuro grossly intact  ECG-sinus bradycardia at a rate of 58, cannot rule out septal infarct, no ST changes.  Personally reviewed  A/P  1 chest pain-no exertional symptoms.  Electrocardiogram shows no ST changes.  We will not pursue further ischemia evaluation.  2 hypertension-blood pressure is mildly elevated.  However she has been instructed to increase Toprol to 75 mg daily.  Continue spironolactone.  She would like to try this regimen before any other changes are made (she has had multiple intolerances in the past).  Presently being evaluated at [facility] and adrenal vein sampling is planned.  3 question history of dilated aortic root-follow-up CTA here in [place] showed no evidence of this.  4 anxiety-managed by primary care.  Kirk Ruths, MD

## 2020-07-20 ENCOUNTER — Other Ambulatory Visit: Payer: Self-pay

## 2020-07-20 ENCOUNTER — Ambulatory Visit: Payer: Managed Care, Other (non HMO) | Admitting: Cardiology

## 2020-07-20 ENCOUNTER — Encounter: Payer: Self-pay | Admitting: Cardiology

## 2020-07-20 VITALS — BP 160/92 | HR 58 | Ht 64.0 in | Wt 181.6 lb

## 2020-07-20 DIAGNOSIS — I10 Essential (primary) hypertension: Secondary | ICD-10-CM | POA: Diagnosis not present

## 2020-07-20 DIAGNOSIS — D3500 Benign neoplasm of unspecified adrenal gland: Secondary | ICD-10-CM | POA: Diagnosis not present

## 2020-07-20 DIAGNOSIS — R072 Precordial pain: Secondary | ICD-10-CM

## 2020-07-20 NOTE — Patient Instructions (Signed)

## 2020-08-17 ENCOUNTER — Encounter: Payer: Self-pay | Admitting: Internal Medicine

## 2020-09-11 ENCOUNTER — Encounter: Payer: Self-pay | Admitting: Pharmacist

## 2020-09-22 ENCOUNTER — Other Ambulatory Visit: Payer: Managed Care, Other (non HMO)

## 2020-09-22 ENCOUNTER — Ambulatory Visit
Admission: RE | Admit: 2020-09-22 | Discharge: 2020-09-22 | Disposition: A | Discharge status: Home / Self Care | Payer: Managed Care, Other (non HMO) | Source: Ambulatory Visit | Attending: Nephrology | Admitting: Nephrology

## 2020-09-22 ENCOUNTER — Other Ambulatory Visit: Payer: Self-pay | Admitting: Nephrology

## 2020-09-22 DIAGNOSIS — R109 Unspecified abdominal pain: Secondary | ICD-10-CM

## 2020-09-22 DIAGNOSIS — R10A0 Flank pain, unspecified side: Secondary | ICD-10-CM

## 2020-09-28 ENCOUNTER — Encounter: Payer: Self-pay | Admitting: Family

## 2020-09-29 ENCOUNTER — Other Ambulatory Visit: Payer: Self-pay | Admitting: Family

## 2020-09-29 DIAGNOSIS — N23 Unspecified renal colic: Secondary | ICD-10-CM

## 2020-10-04 ENCOUNTER — Other Ambulatory Visit: Payer: Self-pay

## 2020-10-04 ENCOUNTER — Other Ambulatory Visit (INDEPENDENT_AMBULATORY_CARE_PROVIDER_SITE_OTHER): Payer: Managed Care, Other (non HMO)

## 2020-10-04 DIAGNOSIS — N23 Unspecified renal colic: Secondary | ICD-10-CM

## 2020-10-04 LAB — D-DIMER, QUANTITATIVE: D-Dimer, Quant: <0.19 mcg/mL FEU (ref <0.50)

## 2020-10-28 ENCOUNTER — Ambulatory Visit: Payer: Managed Care, Other (non HMO) | Admitting: Adult Health

## 2020-11-03 ENCOUNTER — Encounter: Payer: Self-pay | Admitting: Family

## 2020-11-04 ENCOUNTER — Other Ambulatory Visit: Payer: Self-pay | Admitting: Family

## 2020-11-04 DIAGNOSIS — R109 Unspecified abdominal pain: Secondary | ICD-10-CM

## 2020-11-08 ENCOUNTER — Other Ambulatory Visit: Payer: Self-pay

## 2020-11-08 ENCOUNTER — Other Ambulatory Visit (INDEPENDENT_AMBULATORY_CARE_PROVIDER_SITE_OTHER): Payer: Managed Care, Other (non HMO)

## 2020-11-08 DIAGNOSIS — R109 Unspecified abdominal pain: Secondary | ICD-10-CM

## 2020-11-08 LAB — HEPATIC FUNCTION PANEL
ALT: 30 U/L (ref 0–35)
AST: 21 U/L (ref 0–37)
Albumin: 4.4 g/dL (ref 3.5–5.2)
Alkaline Phosphatase: 69 U/L (ref 39–117)
Bilirubin, Direct: 0.1 mg/dL (ref 0.0–0.3)
Total Bilirubin: 0.4 mg/dL (ref 0.2–1.2)
Total Protein: 6.5 g/dL (ref 6.0–8.3)

## 2021-05-17 ENCOUNTER — Encounter: Payer: Self-pay | Admitting: Family

## 2021-05-18 ENCOUNTER — Other Ambulatory Visit: Payer: Self-pay | Admitting: Family

## 2021-05-18 DIAGNOSIS — N951 Menopausal and female climacteric states: Secondary | ICD-10-CM

## 2021-05-22 ENCOUNTER — Encounter: Payer: Self-pay | Admitting: Family

## 2021-05-22 ENCOUNTER — Other Ambulatory Visit: Payer: Self-pay | Admitting: Family

## 2021-05-22 DIAGNOSIS — R300 Dysuria: Secondary | ICD-10-CM

## 2021-05-31 ENCOUNTER — Ambulatory Visit: Payer: Managed Care, Other (non HMO) | Admitting: Radiology

## 2021-09-01 DEATH — deceased

## 2021-09-03 IMAGING — CT CT ABDOMEN W/O CM
2 of 4 series · 15 of 46 positions shown, 17 images · non-contrast
Comparison: CT of the chest abdomen pelvis dated 09/04/2019.

CLINICAL DATA: 55-year-old female with adrenal mass. Uncontrolled
blood pressure.

EXAM:
CT ABDOMEN WITHOUT CONTRAST
TECHNIQUE: Multidetector CT imaging of the abdomen was performed following the
standard protocol without IV contrast.

[Series 2: adrenals 2.00 br40 s3 axial st · axial · 0.88mm/px · z∈[+1371,+1625]mm · 12 of 141 slices shown, 14 images]
[im 7/141  soft-tissue]
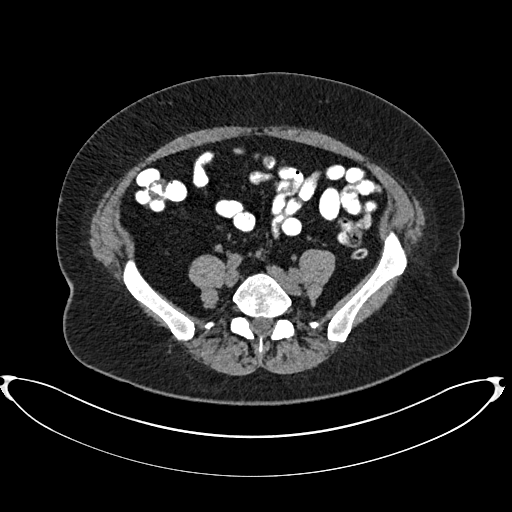
[im 7/141  bone]
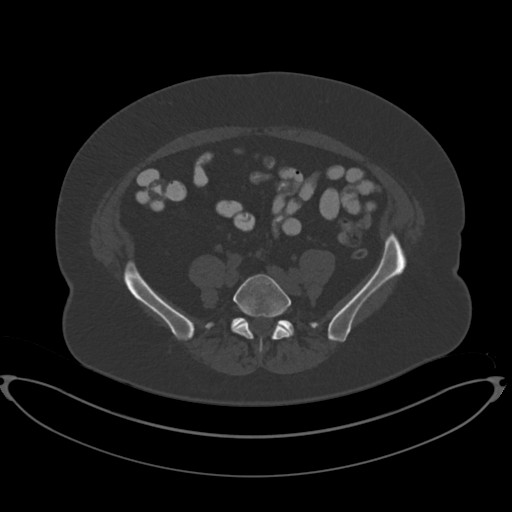
[im 19/141  soft-tissue]
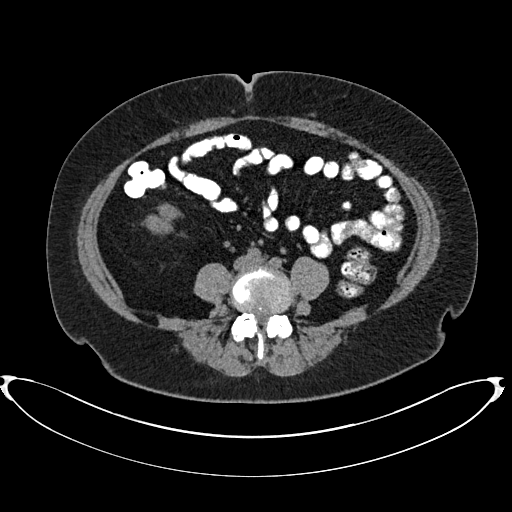
[im 31/141  soft-tissue]
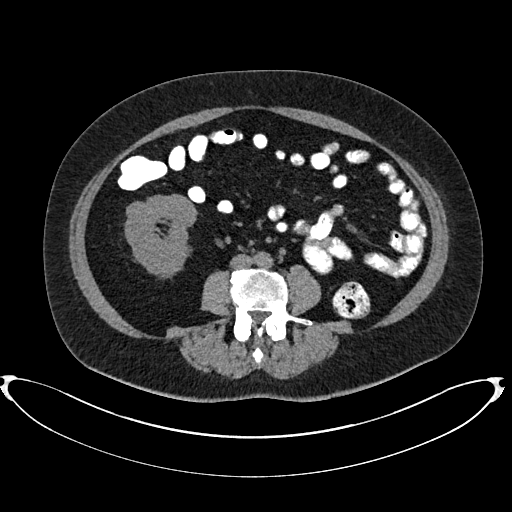
[im 43/141  soft-tissue]
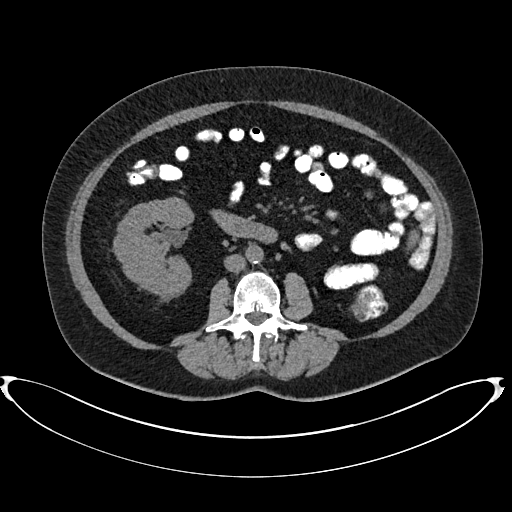
[im 55/141  soft-tissue]
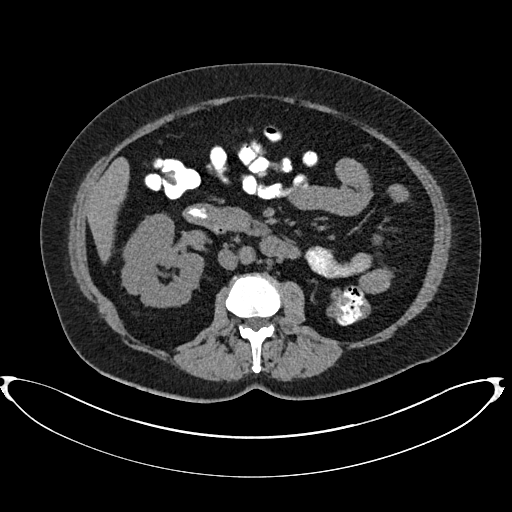
[im 67/141  soft-tissue]
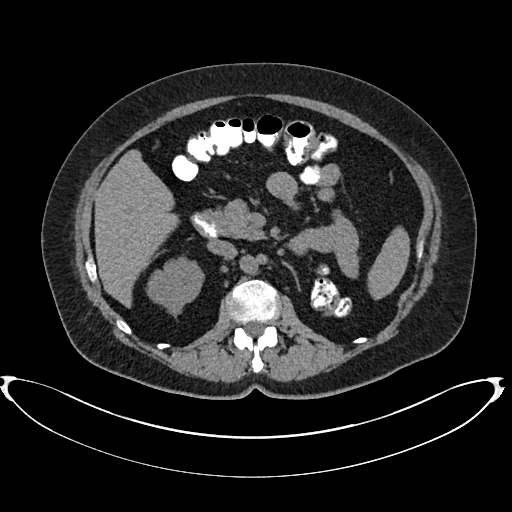
[im 74/141  soft-tissue]
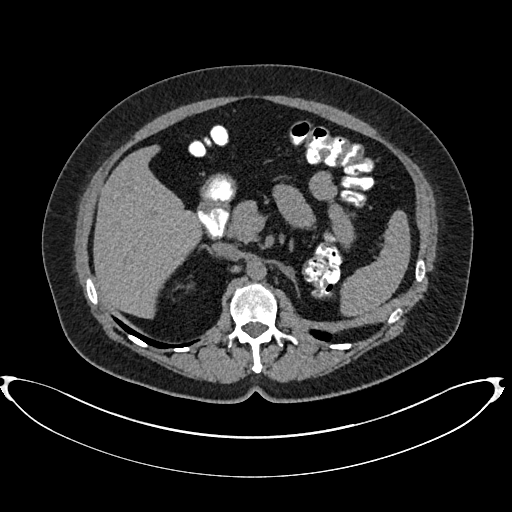
[im 86/141  soft-tissue]
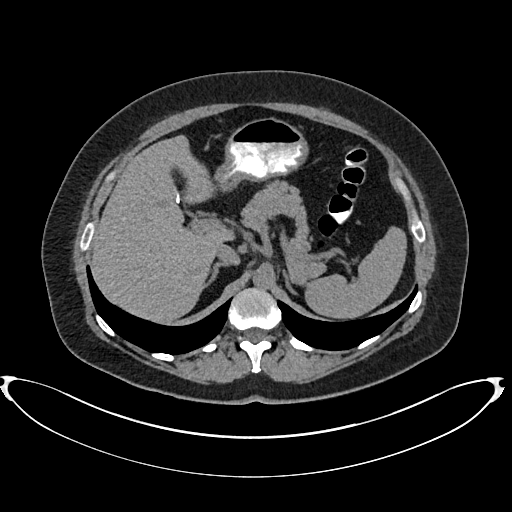
[im 98/141  soft-tissue]
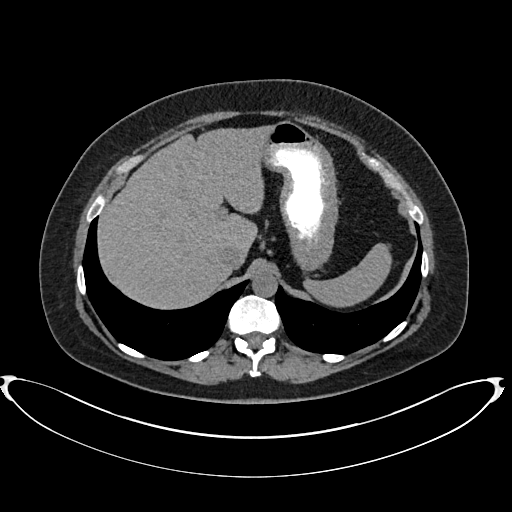
[im 98/141  bone]
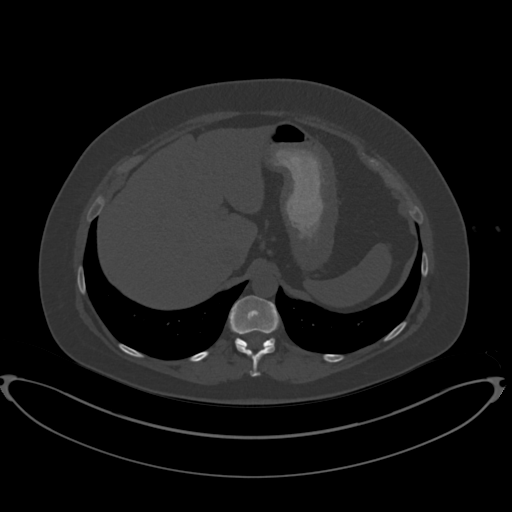
[im 110/141  soft-tissue]
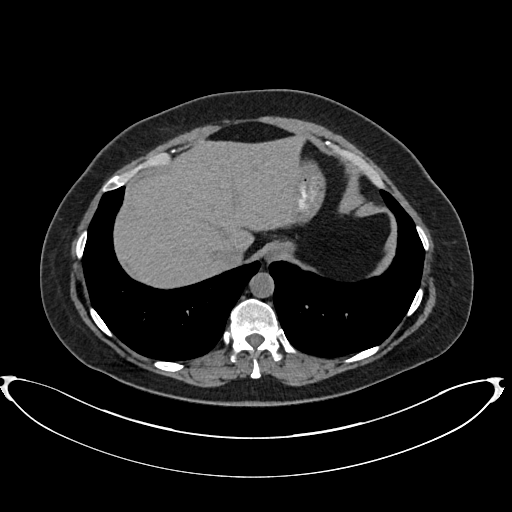
[im 122/141  soft-tissue]
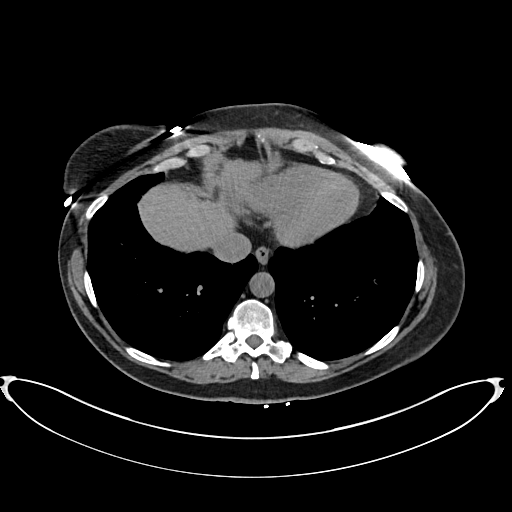
[im 134/141  soft-tissue]
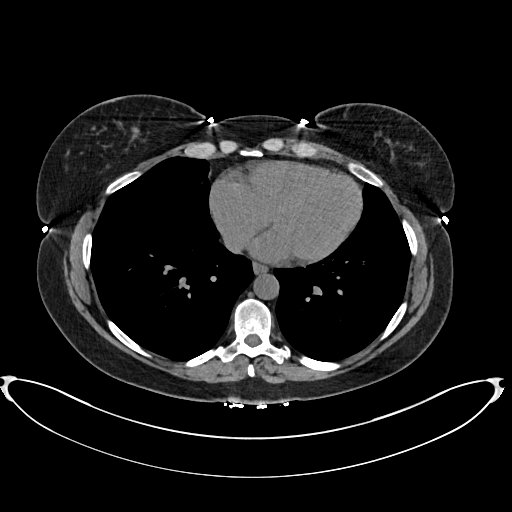

[Series 4: adrenals 2.00 br40 s3 coronal · coronal · 0.55mm/px · 3 of 224 slices shown]
[im 75/224  soft-tissue]
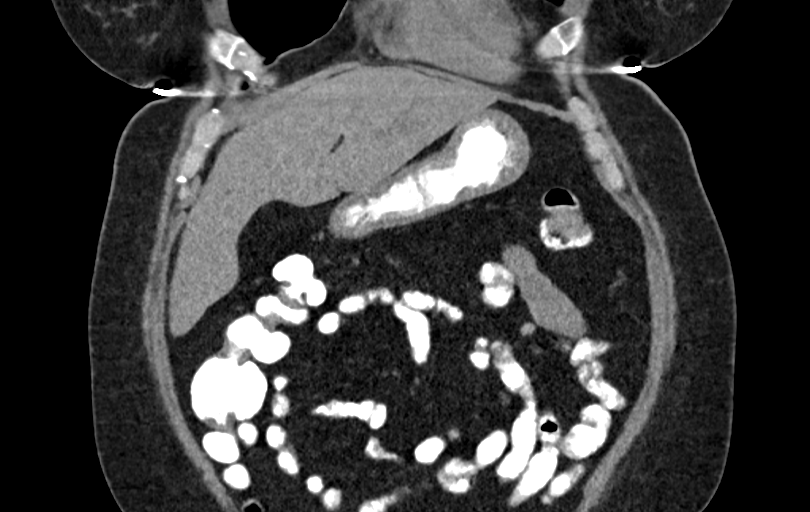
[im 100/224  soft-tissue]
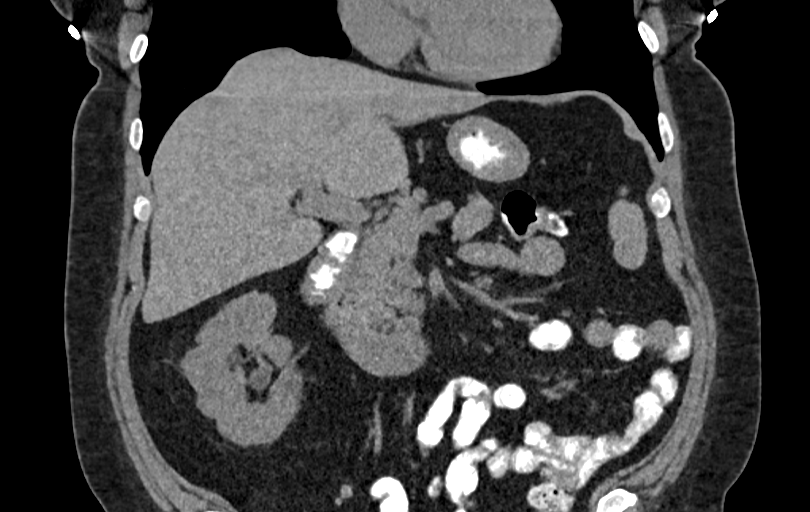
[im 124/224  soft-tissue]
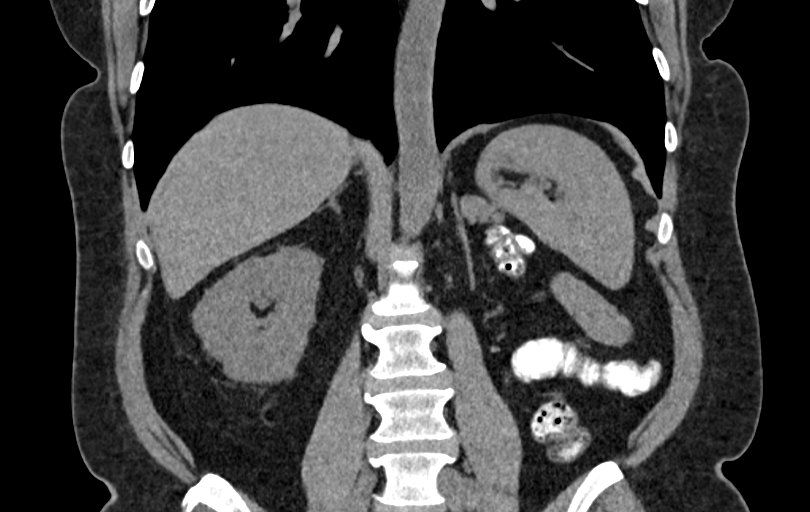

[15 of 46 positions shown; findings below may reference images not displayed]

FINDINGS: Evaluation of this exam is limited in the absence of intravenous
contrast.

Lower chest: The visualized lung bases are clear.

No intra-abdominal free air or free fluid.

Hepatobiliary: The liver is unremarkable. No intrahepatic biliary
dilatation. Cholecystectomy. No retained calcified stone noted in
the central CBD.

Pancreas: Unremarkable. No pancreatic ductal dilatation or
surrounding inflammatory changes.

Spleen: Normal in size without focal abnormality.

Adrenals/Urinary Tract: There is an 8 mm indeterminate nodule
arising from the left adrenal gland, similar to the CTs dating back
to 1663, likely an adenoma. Dedicated MRI may provide better
characterization if clinically indicated. The right adrenal glands
unremarkable. There is a solitary right kidney. There is no
hydronephrosis or nephrolithiasis. The visualized ureter appears
unremarkable.

Stomach/Bowel: There is no bowel obstruction or active inflammation.

Vascular/Lymphatic: Mild atherosclerotic calcification of the
abdominal aorta. The IVC is unremarkable. No portal venous gas.
There is no adenopathy.

Other: None

Musculoskeletal: Mild degenerative changes of the spine. No acute
osseous pathology.
IMPRESSION: 1. No acute intra-abdominal pathology.
2. Indeterminate subcentimeter left adrenal nodule, likely an
adenoma.
3. Solitary right kidney. No hydronephrosis or nephrolithiasis.
4. Aortic Atherosclerosis ([id number]).
